# Patient Record
Sex: Female | Born: 1955 | Race: White | Hispanic: No | Smoking: Never smoker
Health system: Southern US, Community
[De-identification: ages and names within clinical notes are randomized; demographics above are authoritative.]

## PROBLEM LIST (undated history)

## (undated) DIAGNOSIS — E119 Type 2 diabetes mellitus without complications: Secondary | ICD-10-CM

## (undated) DIAGNOSIS — E785 Hyperlipidemia, unspecified: Secondary | ICD-10-CM

## (undated) DIAGNOSIS — D034 Melanoma in situ of scalp and neck: Secondary | ICD-10-CM

## (undated) DIAGNOSIS — T7840XA Allergy, unspecified, initial encounter: Secondary | ICD-10-CM

## (undated) HISTORY — DX: Allergy, unspecified, initial encounter: T78.40XA

## (undated) HISTORY — DX: Melanoma in situ of scalp and neck: D03.4

## (undated) HISTORY — DX: Type 2 diabetes mellitus without complications: E11.9

## (undated) HISTORY — PX: FOOT FRACTURE SURGERY: SHX645

## (undated) HISTORY — DX: Hyperlipidemia, unspecified: E78.5

---

## 2016-03-28 LAB — HM DIABETES EYE EXAM

## 2019-12-23 LAB — COLOGUARD
COLOGUARD: NEGATIVE
Cologuard: NEGATIVE

## 2020-03-29 LAB — HM DIABETES EYE EXAM

## 2020-08-25 ENCOUNTER — Other Ambulatory Visit: Payer: Self-pay

## 2020-08-25 ENCOUNTER — Encounter: Payer: Self-pay | Admitting: Radiology

## 2020-08-25 ENCOUNTER — Encounter: Payer: Self-pay | Admitting: Obstetrics & Gynecology

## 2020-08-25 ENCOUNTER — Other Ambulatory Visit (HOSPITAL_COMMUNITY)
Admission: RE | Admit: 2020-08-25 | Discharge: 2020-08-25 | Disposition: A | Discharge status: Home / Self Care | Payer: Medicare Other | Source: Ambulatory Visit | Attending: Obstetrics & Gynecology | Admitting: Obstetrics & Gynecology

## 2020-08-25 ENCOUNTER — Ambulatory Visit (INDEPENDENT_AMBULATORY_CARE_PROVIDER_SITE_OTHER): Payer: Medicare Other | Admitting: Obstetrics & Gynecology

## 2020-08-25 VITALS — BP 112/72 | HR 88 | Ht 67.0 in | Wt 186.0 lb

## 2020-08-25 DIAGNOSIS — N814 Uterovaginal prolapse, unspecified: Secondary | ICD-10-CM

## 2020-08-25 DIAGNOSIS — Z01419 Encounter for gynecological examination (general) (routine) without abnormal findings: Secondary | ICD-10-CM | POA: Insufficient documentation

## 2020-08-25 DIAGNOSIS — Z1231 Encounter for screening mammogram for malignant neoplasm of breast: Secondary | ICD-10-CM

## 2020-08-25 NOTE — Progress Notes (Signed)
GYNECOLOGY ANNUAL PREVENTATIVE CARE ENCOUNTER NOTE  History:     Amber English is a 64 y.o. [id number] female here to establish care and also to discuss concern about possible uterine prolapse. Just moved here from [place], [place], has not seen a GYN provider in years as her PCP was doing her pap smears.  She is due for a routine annual gynecologic exam.  Reports noticing prolapse recently, the bulge causes some irritation in posterior vagina. Also reports urinary symptoms, incomplete emptying also has stress and urge incontinence. History of SVD x 5.  Denies abnormal vaginal bleeding, discharge, pelvic pain, problems with intercourse or other gynecologic concerns.    Gynecologic History No LMP recorded. Patient is postmenopausal. Last Pap: 2018. Results were: normal with negative HPV Last mammogram: 08/2019. Results were: normal  Obstetric History OB History  Gravida Para Term Preterm AB Living  5         5  SAB TAB Ectopic Multiple Live Births          5    # Outcome Date GA Lbr Len/2nd Weight Sex Delivery Anes PTL Lv  5 Gravida 09/1993     Vag-Spont     4 Gravida 07/1987     Vag-Spont     3 Gravida 05/1985     Vag-Spont     2 Gravida 10/1977     Vag-Spont     1 Gravida 11/1971     Vag-Spont       Past Medical History:  Diagnosis Date  . Diabetes mellitus without complication [facility])     Past Surgical History:  Procedure Laterality Date  . FOOT FRACTURE SURGERY      Current Outpatient Medications on File Prior to Visit  Medication Sig Dispense Refill  . aspirin 81 MG chewable tablet Chew 81 mg by mouth daily.    Marland Kitchen atorvastatin (LIPITOR) 80 MG tablet Take 80 mg by mouth daily.    Marland Kitchen gabapentin (NEURONTIN) 300 MG capsule Take 300 mg by mouth 3 (three) times daily.    Marland Kitchen glipiZIDE (GLUCOTROL) 5 MG tablet Take by mouth 2 (two) times daily before a meal.    . lisinopril (ZESTRIL) 2.5 MG tablet Take 2.5 mg by mouth daily.    . metFORMIN (GLUMETZA) 500 MG (MOD) 24 hr tablet  Take 500 mg by mouth daily with breakfast. Take 4 pills a day     No current facility-administered medications on file prior to visit.    No Known Allergies  Social History:  reports that she has never smoked. She has never used smokeless tobacco. She reports current alcohol use. She reports previous drug use.  History reviewed. No pertinent family history.  The following portions of the patient's history were reviewed and updated as appropriate: allergies, current medications, past family history, past medical history, past social history, past surgical history and problem list.  Review of Systems Pertinent items noted in HPI and remainder of comprehensive ROS otherwise negative.  Physical Exam:  BP 112/72   Pulse 88   Ht 5\' 7"  (1.702 m)   Wt 186 lb (84.4 kg)   BMI 29.13 kg/m  CONSTITUTIONAL: Well-developed, well-nourished female in no acute distress.  HENT:  Normocephalic, atraumatic, External right and left ear normal. Oropharynx is clear and moist EYES: Conjunctivae and EOM are normal. Pupils are equal, round, and reactive to light. No scleral icterus.  NECK: Normal range of motion, supple, no masses.  Normal thyroid.  SKIN: Skin is warm and dry. No  rash noted. Not diaphoretic. No erythema. No pallor. MUSCULOSKELETAL: Normal range of motion. No tenderness.  No cyanosis, clubbing, or edema.  2+ distal pulses. NEUROLOGIC: Alert and oriented to person, place, and time. Normal reflexes, muscle tone coordination.  PSYCHIATRIC: Normal mood and affect. Normal behavior. Normal judgment and thought content. CARDIOVASCULAR: Normal heart rate noted, regular rhythm RESPIRATORY: Clear to auscultation bilaterally. Effort and breath sounds normal, no problems with respiration noted. BREASTS: Symmetric in size. No masses, tenderness, skin changes, nipple drainage, or lymphadenopathy bilaterally. Performed in the presence of a chaperone. ABDOMEN: Soft, no distention noted.  No tenderness,  rebound or guarding.  PELVIC: Normal appearing external genitalia and urethral meatus with moderate atrophy.  Grade 2 cystocele noted immediately.  Atrophic vaginal mucosa and cervix, Grade 1 uterine prolapse noted.  Small rectocele noted.   No abnormal discharge noted.  Pap smear obtained.  Normal uterine size, no other palpable masses, no uterine or adnexal tenderness.  If pessary needed, needs one at least 7-8 cm wide. Performed in the presence of a chaperone.   Assessment and Plan:      1. Breast cancer screening by mammogram Mammogram scheduled - MM 3D SCREEN BREAST BILATERAL; Future  2. Well woman exam with routine gynecological exam - Cytology - PAP Will follow up results of pap smear and manage accordingly. Routine preventative health maintenance measures emphasized.  3. Uterovaginal prolapse Discussed management strategies: pessary vs surgery.  She desires surgical intervention. Also requires full pelvic organ prolapse staging and possible urodynamics given her symptoms. Referral placed to Dr. Lavella Hammock at [facility]. She declined pessary use in the interim. - Ambulatory referral to Urogynecology  Please refer to After Visit Summary for other counseling recommendations.      Jaynie Collins, MD, FACOG Obstetrician & Gynecologist, [facility] for [employer], [facility]

## 2020-08-25 NOTE — Patient Instructions (Signed)
Thank you for enrolling in [facility]. Please follow the instructions below to securely access your online medical record. MyChart allows you to send messages to your doctor, view your test results, manage appointments, and more.   How Do I Sign Up? 1. In your Internet browser, go to [employer] and enter [email or web address]. 2. Click on the Sign Up Now link in the Sign In box. You will see the New Member Sign Up page. 3. Enter your MyChart Access Code exactly as it appears below. You will not need to use this code after you've completed the sign-up process. If you do not sign up before the expiration date, you must request a new code.  MyChart Access Code: [email or web address] Expires: 10/09/2020 10:24 AM  4. Enter your Social Security Number ([id number]) and Date of Birth (mm/dd/yyyy) as indicated and click Submit. You will be taken to the next sign-up page. 5. Create a MyChart ID. This will be your MyChart login ID and cannot be changed, so think of one that is secure and easy to remember. 6. Create a MyChart password. You can change your password at any time. 7. Enter your Password Reset Question and Answer. This can be used at a later time if you forget your password.  8. Enter your e-mail address. You will receive e-mail notification when new information is available in [facility]. 9. Click Sign Up. You can now view your medical record.   Additional Information Remember, MyChart is NOT to be used for urgent needs. For medical emergencies, dial 911.   Pelvic Organ Prolapse Pelvic organ prolapse is the stretching, bulging, or dropping of pelvic organs into an abnormal position. It happens when the muscles and tissues that surround and support pelvic structures become weak or stretched. Pelvic organ prolapse can involve the:  Vagina (vaginal prolapse).  Uterus (uterine prolapse).  Bladder (cystocele).  Rectum (rectocele).  Intestines (enterocele). When organs other than the  vagina are involved, they often bulge into the vagina or protrude from the vagina, depending on how severe the prolapse is. What are the causes? This condition may be caused by:  Pregnancy, labor, and childbirth.  Past pelvic surgery.  Decreased production of the hormone estrogen associated with menopause.  Consistently lifting more than 50 lb (23 kg).  Obesity.  Long-term inability to pass stool (chronic constipation).  A cough that lasts a long time (chronic).  Buildup of fluid in the abdomen due to certain diseases and other conditions. What are the signs or symptoms? Symptoms of this condition include:  Passing a little urine (loss of bladder control) when you cough, sneeze, strain, and exercise (stress incontinence). This may be worse immediately after childbirth. It may gradually improve over time.  Feeling pressure in your pelvis or vagina. This pressure may increase when you cough or when you are passing stool.  A bulge that protrudes from the opening of your vagina.  Difficulty passing urine or stool.  Pain in your lower back.  Pain, discomfort, or disinterest in sex.  Repeated bladder infections (urinary tract infections).  Difficulty inserting a tampon. In some people, this condition causes no symptoms. How is this diagnosed? This condition may be diagnosed based on a vaginal and rectal exam. During the exam, you may be asked to cough and strain while you are lying down, sitting, and standing up. Your health care provider will determine if other tests are required, such as bladder function tests. How is this treated? Treatment for this condition may depend on your symptoms.  Treatment may include:  Lifestyle changes, such as changes to your diet.  Emptying your bladder at scheduled times (bladder training therapy). This can help reduce or avoid urinary incontinence.  Estrogen. Estrogen may help mild prolapse by increasing the strength and tone of pelvic floor  muscles.  Kegel exercises. These may help mild cases of prolapse by strengthening and tightening the muscles of the pelvic floor.  A soft, flexible device that helps support the vaginal walls and keep pelvic organs in place (pessary). This is inserted into your vagina by your health care provider.  Surgery. This is often the only form of treatment for severe prolapse. Follow these instructions at home:  Avoid drinking beverages that contain caffeine or alcohol.  Increase your intake of high-fiber foods. This can help decrease constipation and straining during bowel movements.  Lose weight if recommended by your health care provider.  Wear a sanitary pad or adult diapers if you have urinary incontinence.  Avoid heavy lifting and straining with exercise and work. Do not hold your breath when you perform mild to moderate lifting and exercise activities. Limit your activities as directed by your health care provider.  Do Kegel exercises as directed by your health care provider. To do this: ? Squeeze your pelvic floor muscles tight. You should feel a tight lift in your rectal area and a tightness in your vaginal area. Keep your stomach, buttocks, and legs relaxed. ? Hold the muscles tight for up to 10 seconds. ? Relax your muscles. ? Repeat this exercise 50 times a day, or as many times as told by your health care provider. Continue to do this exercise for at least 4-6 weeks, or for as long as told by your health care provider.  Take over-the-counter and prescription medicines only as told by your health care provider.  If you have a pessary, take care of it as told by your health care provider.  Keep all follow-up visits as told by your health care provider. This is important. Contact a health care provider if you:  Have symptoms that interfere with your daily activities or sex life.  Need medicine to help with the discomfort.  Notice bleeding from your vagina that is not related to  your period.  Have a fever.  Have pain or bleeding when you urinate.  Have bleeding when you pass stool.  Pass urine when you have sex.  Have chronic constipation.  Have a pessary that falls out.  Have bad smelling vaginal discharge.  Have an unusual, low pain in your abdomen. Summary  Pelvic organ prolapse is the stretching, bulging, or dropping of pelvic organs into an abnormal position. It happens when the muscles and tissues that surround and support pelvic structures become weak or stretched.  When organs other than the vagina are involved, they often bulge into the vagina or protrude from the vagina, depending on how severe the prolapse is.  In most cases, this condition needs to be treated only if it produces symptoms. Treatment may include lifestyle changes, estrogen, Kegel exercises, pessary insertion, or surgery.  Avoid heavy lifting and straining with exercise and work. Do not hold your breath when you perform mild to moderate lifting and exercise activities. Limit your activities as directed by your health care provider. This information is not intended to replace advice given to you by your health care provider. Make sure you discuss any questions you have with your health care provider. Document Revised: 11/20/2017 Document Reviewed: 11/20/2017 Elsevier Patient Education  [place] 65 Years and Older, Female Preventive care refers to lifestyle choices and visits with your health care provider that can promote health and wellness. This includes:  A yearly physical exam. This is also called an annual well check.  Regular dental and eye exams.  Immunizations.  Screening for certain conditions.  Healthy lifestyle choices, such as diet and exercise. What can I expect for my preventive care visit? Physical exam Your health care provider will check:  Height and weight. These may be used to calculate body mass index (BMI), which is a  measurement that tells if you are at a healthy weight.  Heart rate and blood pressure.  Your skin for abnormal spots. Counseling Your health care provider may ask you questions about:  Alcohol, tobacco, and drug use.  Emotional well-being.  Home and relationship well-being.  Sexual activity.  Eating habits.  History of falls.  Memory and ability to understand (cognition).  Work and work Statistician.  Pregnancy and menstrual history. What immunizations do I need?  Influenza (flu) vaccine  This is recommended every year. Tetanus, diphtheria, and pertussis (Tdap) vaccine  You may need a Td booster every 10 years. Varicella (chickenpox) vaccine  You may need this vaccine if you have not already been vaccinated. Zoster (shingles) vaccine  You may need this after age 14. Pneumococcal conjugate (PCV13) vaccine  One dose is recommended after age 67. Pneumococcal polysaccharide (PPSV23) vaccine  One dose is recommended after age 68. Measles, mumps, and rubella (MMR) vaccine  You may need at least one dose of MMR if you were born in 1957 or later. You may also need a second dose. Meningococcal conjugate (MenACWY) vaccine  You may need this if you have certain conditions. Hepatitis A vaccine  You may need this if you have certain conditions or if you travel or work in places where you may be exposed to hepatitis A. Hepatitis B vaccine  You may need this if you have certain conditions or if you travel or work in places where you may be exposed to hepatitis B. Haemophilus influenzae type b (Hib) vaccine  You may need this if you have certain conditions. You may receive vaccines as individual doses or as more than one vaccine together in one shot (combination vaccines). Talk with your health care provider about the risks and benefits of combination vaccines. What tests do I need? Blood tests  Lipid and cholesterol levels. These may be checked every 5 years, or more  frequently depending on your overall health.  Hepatitis C test.  Hepatitis B test. Screening  Lung cancer screening. You may have this screening every year starting at age 22 if you have a 30-pack-year history of smoking and currently smoke or have quit within the past 15 years.  Colorectal cancer screening. All adults should have this screening starting at age 21 and continuing until age 41. Your health care provider may recommend screening at age 47 if you are at increased risk. You will have tests every 1-10 years, depending on your results and the type of screening test.  Diabetes screening. This is done by checking your blood sugar (glucose) after you have not eaten for a while (fasting). You may have this done every 1-3 years.  Mammogram. This may be done every 1-2 years. Talk with your health care provider about how often you should have regular mammograms.  BRCA-related cancer screening. This may be done if you have a family history of  breast, ovarian, tubal, or peritoneal cancers. Other tests  Sexually transmitted disease (STD) testing.  Bone density scan. This is done to screen for osteoporosis. You may have this done starting at age 28. Follow these instructions at home: Eating and drinking  Eat a diet that includes fresh fruits and vegetables, whole grains, lean protein, and low-fat dairy products. Limit your intake of foods with high amounts of sugar, saturated fats, and salt.  Take vitamin and mineral supplements as recommended by your health care provider.  Do not drink alcohol if your health care provider tells you not to drink.  If you drink alcohol: ? Limit how much you have to 0-1 drink a day. ? Be aware of how much alcohol is in your drink. In the [place] one drink equals one 12 oz bottle of beer (355 mL), one 5 oz glass of wine (148 mL), or one 1 oz glass of hard liquor (44 mL). Lifestyle  Take daily care of your teeth and gums.  Stay active. Exercise for at  least 30 minutes on 5 or more days each week.  Do not use any products that contain nicotine or tobacco, such as cigarettes, e-cigarettes, and chewing tobacco. If you need help quitting, ask your health care provider.  If you are sexually active, practice safe sex. Use a condom or other form of protection in order to prevent STIs (sexually transmitted infections).  Talk with your health care provider about taking a low-dose aspirin or statin. What's next?  Go to your health care provider once a year for a well check visit.  Ask your health care provider how often you should have your eyes and teeth checked.  Stay up to date on all vaccines. This information is not intended to replace advice given to you by your health care provider. Make sure you discuss any questions you have with your health care provider. Document Revised: 10/23/2018 Document Reviewed: 10/23/2018 Elsevier Patient Education  2020 [employer].

## 2020-08-26 LAB — CYTOLOGY - PAP
Comment: NEGATIVE
Diagnosis: NEGATIVE
High risk HPV: NEGATIVE

## 2020-09-15 ENCOUNTER — Encounter: Payer: Self-pay | Admitting: Family Medicine

## 2020-09-20 ENCOUNTER — Ambulatory Visit
Admission: RE | Admit: 2020-09-20 | Discharge: 2020-09-20 | Disposition: A | Discharge status: Home / Self Care | Payer: Medicare Other | Source: Ambulatory Visit | Attending: Obstetrics & Gynecology | Admitting: Obstetrics & Gynecology

## 2020-09-20 ENCOUNTER — Other Ambulatory Visit: Payer: Self-pay

## 2020-09-20 DIAGNOSIS — Z1231 Encounter for screening mammogram for malignant neoplasm of breast: Secondary | ICD-10-CM

## 2020-10-25 ENCOUNTER — Encounter: Payer: Self-pay | Admitting: Internal Medicine

## 2020-10-25 ENCOUNTER — Other Ambulatory Visit: Payer: Self-pay

## 2020-10-25 ENCOUNTER — Ambulatory Visit (INDEPENDENT_AMBULATORY_CARE_PROVIDER_SITE_OTHER): Payer: Medicare Other | Admitting: Internal Medicine

## 2020-10-25 VITALS — BP 114/70 | HR 84 | Temp 96.7°F | Ht 66.75 in | Wt 183.0 lb

## 2020-10-25 DIAGNOSIS — E1141 Type 2 diabetes mellitus with diabetic mononeuropathy: Secondary | ICD-10-CM

## 2020-10-25 DIAGNOSIS — Z85820 Personal history of malignant melanoma of skin: Secondary | ICD-10-CM | POA: Diagnosis not present

## 2020-10-25 DIAGNOSIS — E7800 Pure hypercholesterolemia, unspecified: Secondary | ICD-10-CM | POA: Diagnosis not present

## 2020-10-25 DIAGNOSIS — E785 Hyperlipidemia, unspecified: Secondary | ICD-10-CM | POA: Insufficient documentation

## 2020-10-25 LAB — COMPREHENSIVE METABOLIC PANEL
ALT: 16 U/L (ref 0–35)
AST: 15 U/L (ref 0–37)
Albumin: 4.5 g/dL (ref 3.5–5.2)
Alkaline Phosphatase: 65 U/L (ref 39–117)
BUN: 14 mg/dL (ref 6–23)
CO2: 31 mEq/L (ref 19–32)
Calcium: 10.2 mg/dL (ref 8.4–10.5)
Chloride: 103 mEq/L (ref 96–112)
Creatinine, Ser: 0.72 mg/dL (ref 0.40–1.20)
GFR: 87.3 mL/min (ref >60.00)
Glucose, Bld: 67 mg/dL — ABNORMAL LOW (ref 70–99)
Potassium: 4.5 mEq/L (ref 3.5–5.1)
Sodium: 140 mEq/L (ref 135–145)
Total Bilirubin: 0.4 mg/dL (ref 0.2–1.2)
Total Protein: 7.3 g/dL (ref 6.0–8.3)

## 2020-10-25 LAB — LIPID PANEL
Cholesterol: 172 mg/dL (ref 0–200)
HDL: 62.1 mg/dL (ref >39.00)
LDL Cholesterol: 78 mg/dL (ref 0–99)
NonHDL: 109.87
Total CHOL/HDL Ratio: 3
Triglycerides: 161 mg/dL — ABNORMAL HIGH (ref 0.0–149.0)
VLDL: 32.2 mg/dL (ref 0.0–40.0)

## 2020-10-25 LAB — HEMOGLOBIN A1C: Hgb A1c MFr Bld: 6.5 % (ref 4.6–6.5)

## 2020-10-25 NOTE — Progress Notes (Signed)
HPI  Pt presents to the clinic today to establish care and for management of the conditions listed below. She is transferring care from her PCP in [place], [place].  DM 2: Her last A1C was 7.2, 05/2020. She is taking Metformin, Glipizide and Gabapentin as prescribed. She is on Lisinopril for renal protection. Her sugars range 110-149. She checks her feet routinely.  HLD: She does have some myalgias on on Atorvastatin. She tries to consume a low fat diet.  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 No chemo or radiation. She follows with dermatology yearly.  Flu: 08/2019 Tetanus: unsure Covid: Pfizer Pneumovax: ? 2008 Prevnar: never Zostovax: 2008 Pap Smear: 08/2020 Mammogram: 09/2020 Bone Density: 12/2019 Cologuard: 05/2020 Vision Screening: annually Dentist: as needed  Past Medical History:  Diagnosis Date  . Diabetes mellitus without complication (HCC)   . Hyperlipidemia   . Melanoma in situ of scalp [facility])     Current Outpatient Medications  Medication Sig Dispense Refill  . aspirin 81 MG chewable tablet Chew 81 mg by mouth daily.    Marland Kitchen atorvastatin (LIPITOR) 80 MG tablet Take 80 mg by mouth daily.    Marland Kitchen b complex vitamins capsule Take 1 capsule by mouth daily.    . Biotin 1000 MCG tablet Take by mouth.    . calcium-vitamin D (OSCAL WITH D) 500-200 MG-UNIT tablet Take 1 tablet by mouth.    . co-enzyme Q-10 30 MG capsule Take 30 mg by mouth 3 (three) times daily.    . diphenhydrAMINE (BENADRYL) 25 mg capsule Take 50 mg by mouth in the morning and at bedtime.    . gabapentin (NEURONTIN) 300 MG capsule Take 300 mg by mouth 3 (three) times daily.    Marland Kitchen glipiZIDE (GLUCOTROL) 5 MG tablet Take by mouth 2 (two) times daily before a meal.    . glucosamine-chondroitin 500-400 MG tablet Take 1 tablet by mouth 3 (three) times daily.    Marland Kitchen lisinopril (ZESTRIL) 2.5 MG tablet Take 2.5 mg by mouth daily.    . metFORMIN (GLUMETZA) 500 MG (MOD) 24 hr tablet Take 2,000 mg by mouth daily with  breakfast. Take 4 pills a day    . Multiple Vitamin (MULTIVITAMIN) capsule Take 1 capsule by mouth daily.     No current facility-administered medications for this visit.    No Known Allergies  Family History  Problem Relation Age of Onset  . Esophageal cancer Mother   . Diabetes Father   . Heart disease Father   . Diabetes Sister   . Diabetes Brother   . Heart disease Maternal Grandfather   . Diabetes Paternal Grandmother   . Cancer Paternal Grandmother   . Heart disease Paternal Grandfather     Social History   Socioeconomic History  . Marital status: Divorced    Spouse name: Not on file  . Number of children: Not on file  . Years of education: Not on file  . Highest education level: Not on file  Occupational History  . Not on file  Tobacco Use  . Smoking status: Never Smoker  . Smokeless tobacco: Never Used  Vaping Use  . Vaping Use: Never used  Substance and Sexual Activity  . Alcohol use: Yes    Comment: occasional  . Drug use: Not Currently  . Sexual activity: Not Currently    Birth control/protection: None  Other Topics Concern  . Not on file  Social History Narrative  . Not on file   Social Determinants of Health  Financial Resource Strain: Not on file  Food Insecurity: Not on file  Transportation Needs: Not on file  Physical Activity: Not on file  Stress: Not on file  Social Connections: Not on file  Intimate Partner Violence: Not on file    ROS:  Constitutional: Denies fever, malaise, fatigue, headache or abrupt weight changes.  HEENT: Denies eye pain, eye redness, ear pain, ringing in the ears, wax buildup, runny nose, nasal congestion, bloody nose, or sore throat. Respiratory: Denies difficulty breathing, shortness of breath, cough or sputum production.   Cardiovascular: Denies chest pain, chest tightness, palpitations or swelling in the hands or feet.  Gastrointestinal: Denies abdominal pain, bloating, constipation, diarrhea or blood in  the stool.  GU: Denies frequency, urgency, pain with urination, blood in urine, odor or discharge. Musculoskeletal: Pt reports intermittent joint pain. Denies decrease in range of motion, difficulty with gait, muscle pain or joint swelling.  Skin: Denies redness, rashes, lesions or ulcercations.  Neurological: Pt reports neuropathic pain in feet. Denies dizziness, difficulty with memory, difficulty with speech or problems with balance and coordination.  Psych: Denies anxiety, depression, SI/HI.  No other specific complaints in a complete review of systems (except as listed in HPI above).  PE:  BP 114/70   Pulse 84   Temp (!) 96.7 F (35.9 C) (Temporal)   Ht 5' 6.75" (1.695 m)   Wt 183 lb (83 kg)   SpO2 98%   BMI 28.88 kg/m   Wt Readings from Last 3 Encounters:  08/25/20 186 lb (84.4 kg)    General: Appears her stated age, well developed, well nourished in NAD. Skin: Dry and intact. No ulcerations noted. HEENT: Head: normal shape and size; Eyes: sclera white, no icterus, conjunctiva pink, PERRLA and EOMs intact;  Cardiovascular: Normal rate and rhythm. S1,S2 noted.  No murmur, rubs or gallops noted. No JVD or BLE edema. No carotid bruits noted. Pulmonary/Chest: Normal effort and positive vesicular breath sounds. No respiratory distress. No wheezes, rales or ronchi noted.  Musculoskeletal: No difficulty with gait.  Neurological: Alert and oriented.  Psychiatric: Mood and affect normal. Behavior is normal. Judgment and thought content normal.     Assessment and Plan:

## 2020-10-25 NOTE — Assessment & Plan Note (Signed)
A1C, CMET and Lipid profile today Encouraged her to consume a low fat, low carb diet and exercise for weight loss Continue Metformin, Glipizide, Gabapentin and Lisinopril Encouraged routine foot exams Encouraged routine eye exams Due for flu and pneumovax Covid UTD

## 2020-10-25 NOTE — Assessment & Plan Note (Signed)
Follows with dermatology yearly.

## 2020-10-25 NOTE — Assessment & Plan Note (Addendum)
CMET and Lipid profile today Continue Atorvastatin Encouraged her to consume a low fat diet 

## 2020-10-25 NOTE — Patient Instructions (Signed)

## 2020-10-26 ENCOUNTER — Encounter: Payer: Self-pay | Admitting: Internal Medicine

## 2020-11-12 HISTORY — PX: TOTAL ABDOMINAL HYSTERECTOMY: SHX209

## 2020-12-01 ENCOUNTER — Encounter: Payer: Self-pay | Admitting: Radiology

## 2021-02-07 ENCOUNTER — Encounter: Payer: Self-pay | Admitting: Internal Medicine

## 2021-02-09 MED ORDER — METFORMIN HCL ER (MOD) 1000 MG PO TB24: 2000.0000 mg | ORAL_TABLET | Freq: Every day | ORAL | 1 refills | Status: DC

## 2021-02-09 MED ORDER — LISINOPRIL 2.5 MG PO TABS
2.5000 mg | ORAL_TABLET | Freq: Every day | ORAL | 1 refills | Status: DC
Start: 2021-02-08 — End: 2021-08-09

## 2021-02-09 MED ORDER — METFORMIN HCL ER 500 MG PO TB24: 2000.0000 mg | ORAL_TABLET | Freq: Every day | ORAL | 1 refills | Status: DC

## 2021-02-09 MED ORDER — ATORVASTATIN CALCIUM 80 MG PO TABS
80.0000 mg | ORAL_TABLET | Freq: Every day | ORAL | 1 refills | Status: DC
Start: 2021-02-08 — End: 2021-03-28

## 2021-02-09 MED ORDER — GABAPENTIN 300 MG PO CAPS
300.0000 mg | ORAL_CAPSULE | Freq: Three times a day (TID) | ORAL | 1 refills | Status: DC
Start: 2021-02-08 — End: 2022-01-30

## 2021-02-09 MED ORDER — METFORMIN HCL ER (MOD) 500 MG PO TB24
2000.0000 mg | ORAL_TABLET | Freq: Every day | ORAL | 1 refills | Status: DC
Start: 2021-02-08 — End: 2021-02-09

## 2021-02-09 MED ORDER — GLIPIZIDE 5 MG PO TABS
5.0000 mg | ORAL_TABLET | Freq: Two times a day (BID) | ORAL | 1 refills | Status: DC
Start: 2021-02-08 — End: 2021-08-09

## 2021-02-09 NOTE — Addendum Note (Signed)
Addended by: Roena Malady on: 02/09/2021 09:58 AM   Modules accepted: Orders

## 2021-02-09 NOTE — Telephone Encounter (Signed)
Can you check on this?

## 2021-02-09 NOTE — Addendum Note (Signed)
Addended by: Roena Malady on: 02/09/2021 11:55 AM   Modules accepted: Orders

## 2021-03-09 ENCOUNTER — Telehealth: Payer: Self-pay | Admitting: Primary Care

## 2021-03-09 NOTE — Telephone Encounter (Addendum)
Amber English, patient will be transferring care over to [place] from [facility] and will need to be seen in May for follow up. I've spoken with patient via phone already. She just needs to be scheduled, can you reach out to her personally?  She will need a lab appt a few days prior to her appt with me. She is aware.

## 2021-03-10 NOTE — Telephone Encounter (Signed)
Called both appointments have been made with patient. Will let us know if any issues.

## 2021-03-13 ENCOUNTER — Other Ambulatory Visit: Payer: Self-pay

## 2021-03-13 DIAGNOSIS — E7800 Pure hypercholesterolemia, unspecified: Secondary | ICD-10-CM

## 2021-03-13 DIAGNOSIS — E1141 Type 2 diabetes mellitus with diabetic mononeuropathy: Secondary | ICD-10-CM

## 2021-03-22 ENCOUNTER — Other Ambulatory Visit: Payer: Self-pay | Admitting: Primary Care

## 2021-03-22 ENCOUNTER — Other Ambulatory Visit (INDEPENDENT_AMBULATORY_CARE_PROVIDER_SITE_OTHER): Payer: Medicare Other

## 2021-03-22 ENCOUNTER — Other Ambulatory Visit: Payer: Self-pay

## 2021-03-22 DIAGNOSIS — E1141 Type 2 diabetes mellitus with diabetic mononeuropathy: Secondary | ICD-10-CM

## 2021-03-22 DIAGNOSIS — E7800 Pure hypercholesterolemia, unspecified: Secondary | ICD-10-CM | POA: Diagnosis not present

## 2021-03-22 LAB — COMPREHENSIVE METABOLIC PANEL
ALT: 16 U/L (ref 0–35)
AST: 15 U/L (ref 0–37)
Albumin: 4.2 g/dL (ref 3.5–5.2)
Alkaline Phosphatase: 62 U/L (ref 39–117)
BUN: 18 mg/dL (ref 6–23)
CO2: 31 mEq/L (ref 19–32)
Calcium: 9.9 mg/dL (ref 8.4–10.5)
Chloride: 103 mEq/L (ref 96–112)
Creatinine, Ser: 0.75 mg/dL (ref 0.40–1.20)
GFR: 82.89 mL/min (ref >60.00)
Glucose, Bld: 116 mg/dL — ABNORMAL HIGH (ref 70–99)
Potassium: 4.6 mEq/L (ref 3.5–5.1)
Sodium: 140 mEq/L (ref 135–145)
Total Bilirubin: 0.4 mg/dL (ref 0.2–1.2)
Total Protein: 6.7 g/dL (ref 6.0–8.3)

## 2021-03-22 LAB — LIPID PANEL
Cholesterol: 177 mg/dL (ref 0–200)
HDL: 61.7 mg/dL (ref >39.00)
LDL Cholesterol: 88 mg/dL (ref 0–99)
NonHDL: 114.94
Total CHOL/HDL Ratio: 3
Triglycerides: 135 mg/dL (ref 0.0–149.0)
VLDL: 27 mg/dL (ref 0.0–40.0)

## 2021-03-22 LAB — CBC WITH DIFFERENTIAL/PLATELET
Basophils Absolute: 0.1 10*3/uL (ref 0.0–0.1)
Basophils Relative: 0.8 % (ref 0.0–3.0)
Eosinophils Absolute: 0.2 10*3/uL (ref 0.0–0.7)
Eosinophils Relative: 2.5 % (ref 0.0–5.0)
HCT: 37.4 % (ref 36.0–46.0)
Hemoglobin: 12.7 g/dL (ref 12.0–15.0)
Lymphocytes Relative: 34.1 % (ref 12.0–46.0)
Lymphs Abs: 2.8 10*3/uL (ref 0.7–4.0)
MCHC: 34.1 g/dL (ref 30.0–36.0)
MCV: 93 fl (ref 78.0–100.0)
Monocytes Absolute: 0.6 10*3/uL (ref 0.1–1.0)
Monocytes Relative: 7 % (ref 3.0–12.0)
Neutro Abs: 4.6 10*3/uL (ref 1.4–7.7)
Neutrophils Relative %: 55.6 % (ref 43.0–77.0)
Platelets: 233 10*3/uL (ref 150.0–400.0)
RBC: 4.02 Mil/uL (ref 3.87–5.11)
RDW: 13.4 % (ref 11.5–15.5)
WBC: 8.2 10*3/uL (ref 4.0–10.5)

## 2021-03-22 LAB — HEMOGLOBIN A1C: Hgb A1c MFr Bld: 6.4 % (ref 4.6–6.5)

## 2021-03-22 NOTE — Addendum Note (Signed)
Addended by: Aquilla Solian on: 03/22/2021 02:11 PM   Modules accepted: Orders

## 2021-03-23 LAB — MICROALBUMIN / CREATININE URINE RATIO
Creatinine,U: 47.6 mg/dL
Microalb Creat Ratio: 9.7 mg/g (ref 0.0–30.0)
Microalb, Ur: 4.6 mg/dL — ABNORMAL HIGH (ref 0.0–1.9)

## 2021-03-28 ENCOUNTER — Ambulatory Visit (INDEPENDENT_AMBULATORY_CARE_PROVIDER_SITE_OTHER)
Admission: RE | Admit: 2021-03-28 | Discharge: 2021-03-28 | Disposition: A | Discharge status: Home / Self Care | Payer: Medicare Other | Source: Ambulatory Visit | Attending: Primary Care | Admitting: Primary Care

## 2021-03-28 ENCOUNTER — Encounter: Payer: Self-pay | Admitting: Primary Care

## 2021-03-28 ENCOUNTER — Ambulatory Visit (INDEPENDENT_AMBULATORY_CARE_PROVIDER_SITE_OTHER): Payer: Medicare Other | Admitting: Primary Care

## 2021-03-28 ENCOUNTER — Other Ambulatory Visit: Payer: Self-pay

## 2021-03-28 ENCOUNTER — Ambulatory Visit (HOSPITAL_BASED_OUTPATIENT_CLINIC_OR_DEPARTMENT_OTHER): Payer: Medicare Other | Admitting: Family Medicine

## 2021-03-28 VITALS — BP 120/74 | HR 80 | Temp 98.6°F | Ht 66.75 in | Wt 188.0 lb

## 2021-03-28 DIAGNOSIS — H9193 Unspecified hearing loss, bilateral: Secondary | ICD-10-CM

## 2021-03-28 DIAGNOSIS — E1141 Type 2 diabetes mellitus with diabetic mononeuropathy: Secondary | ICD-10-CM

## 2021-03-28 DIAGNOSIS — M25551 Pain in right hip: Secondary | ICD-10-CM

## 2021-03-28 DIAGNOSIS — Z85820 Personal history of malignant melanoma of skin: Secondary | ICD-10-CM | POA: Diagnosis not present

## 2021-03-28 DIAGNOSIS — G8929 Other chronic pain: Secondary | ICD-10-CM

## 2021-03-28 DIAGNOSIS — E7800 Pure hypercholesterolemia, unspecified: Secondary | ICD-10-CM | POA: Diagnosis not present

## 2021-03-28 MED ORDER — METFORMIN HCL ER 500 MG PO TB24: 1000.0000 mg | ORAL_TABLET | Freq: Two times a day (BID) | ORAL | 1 refills | Status: DC

## 2021-03-28 MED ORDER — DICLOFENAC SODIUM 75 MG PO TBEC: 75.0000 mg | DELAYED_RELEASE_TABLET | Freq: Two times a day (BID) | ORAL | 1 refills | Status: DC | PRN

## 2021-03-28 NOTE — Assessment & Plan Note (Signed)
Cerumen noted to bilateral canals.  Patient consented to irrigation. Bilateral canals irrigated. TMs and canals unremarkable post irrigation. Patient tolerated well and was able to hear a little bit better.

## 2021-03-28 NOTE — Assessment & Plan Note (Signed)
Chronic for years, slowly progressing.  Exam today with clear decreased range of motion, likely bursitis/arthritis.  Start with plain films of the right hip. Discussed physical therapy versus orthopedic evaluation.  Prescription for diclofenac 75 mg tablets sent to pharmacy to use as needed.  Await results

## 2021-03-28 NOTE — Progress Notes (Signed)
Subjective:    Patient ID: Amber English, female    DOB: [birth year], 65 y.o.   MRN: [id number]  HPI  Amber English is a very pleasant 66 y.o. female patient of Nicki Reaper who presents today to transfer care.  1) Type 2 Diabetes:  Current medications include: Metformin XR 2000 mg daily, glipizide 5 mg BID.   Last A1C: 6.5 in December 2021, 6.4 last week Last Eye Exam: UTD Last Foot Exam: Due Pneumonia Vaccination: UTD ACE/ARB: Lisinopril 2.5 mg Statin: atorvastatin 40 mg.   She is also managed on gabapentin 300 mg TID for diabetic neuropathy for which she notices to her feet at night. She does notice numbness up her legs at times. She is taking gabapentin 2-3 times daily on average.   2) Hyperlipidemia: Previously managed on atorvastatin 80 mg, but has been cutting her dose to 40 mg for the last 6 months. Recent lipid panel with LDL of 88, HDL of 61.  3) Joint Pain: Mostly feels to the right hip, chronic for which she initially noticed in 2003.  Since then her pain has progressed slowly, now has to sleep on her back as she cannot lay on either side due to pain. She experiences pain when walking up and down the stairs. She has no difficulty with normal walking, can walk 5000 steps daily without problems.  She recently went on a trip to [facility] and walked 20,000 steps which did bother her hip.  She will take ibuprofen 2-3 times weekly with improvement in pain.  Also with chronic right 5th metacarpal digit pain for the last 6 months, at times her finger will get stuck, she will have to manual adjust. She's been using Voltaren Gel with improvement. She crotchets everyday and has had to alter the way she crochets, also cannot completely close her hands to grip objects.    4) Decreased Hearing: Has been told by her children that she's not hearing what they are saying. Has been told that the TV is turned up too loud. History of cerumen impaction over the years.  She has not undergone  a hearing test.   Review of Systems  HENT:       Decreased hearing  Respiratory: Negative for shortness of breath.   Cardiovascular: Negative for chest pain.  Musculoskeletal: Positive for arthralgias.       See HPI  Neurological:       Neuropathy pain         Past Medical History:  Diagnosis Date  . Diabetes mellitus without complication (HCC)   . Hyperlipidemia   . Melanoma in situ of scalp [facility])     Social History   Socioeconomic History  . Marital status: Divorced    Spouse name: Not on file  . Number of children: Not on file  . Years of education: Not on file  . Highest education level: Not on file  Occupational History  . Not on file  Tobacco Use  . Smoking status: Never Smoker  . Smokeless tobacco: Never Used  Vaping Use  . Vaping Use: Never used  Substance and Sexual Activity  . Alcohol use: Yes    Comment: occasional  . Drug use: Not Currently  . Sexual activity: Not Currently    Birth control/protection: None  Other Topics Concern  . Not on file  Social History Narrative  . Not on file   Social Determinants of Health   Financial Resource Strain: Not on file  Food Insecurity: Not  on file  Transportation Needs: Not on file  Physical Activity: Not on file  Stress: Not on file  Social Connections: Not on file  Intimate Partner Violence: Not on file    Past Surgical History:  Procedure Laterality Date  . FOOT FRACTURE SURGERY      Family History  Problem Relation Age of Onset  . Esophageal cancer Mother   . Diabetes Father   . Heart disease Father   . Diabetes Sister   . Diabetes Brother   . Heart disease Maternal Grandfather   . Diabetes Paternal Grandmother   . Cancer Paternal Grandmother   . Heart disease Paternal Grandfather     No Known Allergies  Current Outpatient Medications on File Prior to Visit  Medication Sig Dispense Refill  . aspirin 81 MG chewable tablet Chew 81 mg by mouth daily.    Marland Kitchen atorvastatin (LIPITOR) 80 MG  tablet Take 1 tablet (80 mg total) by mouth daily. (Patient taking differently: Take 40 mg by mouth daily.) 90 tablet 1  . b complex vitamins capsule Take 1 capsule by mouth daily.    . Biotin 1000 MCG tablet Take by mouth.    . calcium-vitamin D (OSCAL WITH D) 500-200 MG-UNIT tablet Take 1 tablet by mouth.    . co-enzyme Q-10 30 MG capsule Take 30 mg by mouth daily.    Marland Kitchen gabapentin (NEURONTIN) 300 MG capsule Take 1 capsule (300 mg total) by mouth 3 (three) times daily. 180 capsule 1  . glipiZIDE (GLUCOTROL) 5 MG tablet Take 1 tablet (5 mg total) by mouth 2 (two) times daily before a meal. 180 tablet 1  . glucosamine-chondroitin 500-400 MG tablet Take 1 tablet by mouth 3 (three) times daily.    Marland Kitchen lisinopril (ZESTRIL) 2.5 MG tablet Take 1 tablet (2.5 mg total) by mouth daily. 90 tablet 1  . metFORMIN (GLUCOPHAGE-XR) 500 MG 24 hr tablet Take 4 tablets (2,000 mg total) by mouth daily with breakfast. 360 tablet 1  . Multiple Vitamin (MULTIVITAMIN) capsule Take 1 capsule by mouth daily.     No current facility-administered medications on file prior to visit.    BP 120/74   Pulse 80   Temp 98.6 F (37 C) (Temporal)   Ht 5' 6.75" (1.695 m)   Wt 188 lb (85.3 kg)   SpO2 98%   BMI 29.67 kg/m  Objective:   Physical Exam Cardiovascular:     Rate and Rhythm: Normal rate and regular rhythm.  Pulmonary:     Effort: Pulmonary effort is normal.     Breath sounds: Normal breath sounds.  Musculoskeletal:     Cervical back: Neck supple.     Right hip: Decreased range of motion. Normal strength.     Left hip: Normal range of motion. Normal strength.     Comments: Decrease in range of motion with pain to right hip with external rotation.  Skin:    General: Skin is warm and dry.           Assessment & Plan:      This visit occurred during the SARS-CoV-2 public health emergency.  Safety protocols were in place, including screening questions prior to the visit, additional usage of staff PPE,  and extensive cleaning of exam room while observing appropriate contact time as indicated for disinfecting solutions.

## 2021-03-28 NOTE — Assessment & Plan Note (Signed)
Follows with dermatology annually

## 2021-03-28 NOTE — Assessment & Plan Note (Signed)
LDL under control on atorvastatin 40 mg for which she reduced down from 80 mg months ago.  Continue atorvastatin 40 mg.

## 2021-03-28 NOTE — Assessment & Plan Note (Addendum)
Well controlled in the office today with A1C of 6.4!  Continue metformin XR 2000 mg BID and Glipizide 5 mg BID.  Continue gabapentin 300 mg 2-3 times daily.  Urine microalbumin negative. Pneumonia vaccine UTD. Managed on statin.  Follow up in 6 months

## 2021-03-28 NOTE — Patient Instructions (Signed)
Complete xray(s) prior to leaving today. I will notify you of your results once received.  You can take the diclofenac tablets twice daily as needed for hip pain and trigger finger pain.  We may need to consider physical therapy vs orthopedic evaluation of your hip and finger.   Please schedule a follow up appointment in 6 months.   It was a pleasure to see you today!

## 2021-04-04 LAB — HM DIABETES EYE EXAM

## 2021-04-07 ENCOUNTER — Encounter: Payer: Self-pay | Admitting: Primary Care

## 2021-04-13 ENCOUNTER — Encounter: Payer: Self-pay | Admitting: Primary Care

## 2021-06-05 ENCOUNTER — Other Ambulatory Visit: Payer: Self-pay

## 2021-06-05 DIAGNOSIS — S99929A Unspecified injury of unspecified foot, initial encounter: Secondary | ICD-10-CM

## 2021-06-05 DIAGNOSIS — E1141 Type 2 diabetes mellitus with diabetic mononeuropathy: Secondary | ICD-10-CM

## 2021-06-06 ENCOUNTER — Other Ambulatory Visit: Payer: Self-pay | Admitting: Primary Care

## 2021-06-06 DIAGNOSIS — G8929 Other chronic pain: Secondary | ICD-10-CM

## 2021-06-06 NOTE — Telephone Encounter (Signed)
Please notify patient that we received a refill request for the diclofenac that I prescribed for pain to the hip. Is she taking? If so, how often? Does she need a refill?

## 2021-06-12 ENCOUNTER — Ambulatory Visit: Payer: Medicare Other | Admitting: Podiatry

## 2021-06-12 NOTE — Telephone Encounter (Signed)
Called patient she is taking PRN on average 2-3 times a week for hip pain. Is very helpful. Does not need refill at this time will send my chart message when needed.

## 2021-06-22 ENCOUNTER — Ambulatory Visit: Payer: Medicare Other | Admitting: Podiatry

## 2021-06-26 ENCOUNTER — Ambulatory Visit (INDEPENDENT_AMBULATORY_CARE_PROVIDER_SITE_OTHER): Payer: Medicare Other | Admitting: Podiatry

## 2021-06-26 ENCOUNTER — Other Ambulatory Visit: Payer: Self-pay

## 2021-06-26 ENCOUNTER — Encounter: Payer: Self-pay | Admitting: Podiatry

## 2021-06-26 DIAGNOSIS — M79674 Pain in right toe(s): Secondary | ICD-10-CM | POA: Diagnosis not present

## 2021-06-26 DIAGNOSIS — E1140 Type 2 diabetes mellitus with diabetic neuropathy, unspecified: Secondary | ICD-10-CM

## 2021-06-26 DIAGNOSIS — B351 Tinea unguium: Secondary | ICD-10-CM | POA: Diagnosis not present

## 2021-06-26 DIAGNOSIS — L600 Ingrowing nail: Secondary | ICD-10-CM

## 2021-06-26 DIAGNOSIS — M79675 Pain in left toe(s): Secondary | ICD-10-CM

## 2021-06-26 DIAGNOSIS — E1149 Type 2 diabetes mellitus with other diabetic neurological complication: Secondary | ICD-10-CM | POA: Diagnosis not present

## 2021-06-26 NOTE — Progress Notes (Signed)
Subjective:   Patient ID: Amber English, female   DOB: 65 y.o.   MRN: [id number]   HPI Patient states she seems to have a possible nail fungus and detached nails and states that the patient also has diabetes and is concerned about her inability to cut all of her toenails and they do get irritated in the corners and thick.  Patient does not smoke likes to be active   Review of Systems  All other systems reviewed and are negative.      Objective:  Physical Exam Vitals and nursing note reviewed.  Constitutional:      Appearance: She is well-developed.  Pulmonary:     Effort: Pulmonary effort is normal.  Musculoskeletal:        General: Normal range of motion.  Skin:    General: Skin is warm.  Neurological:     Mental Status: She is alert.    Neurovascular status was found to be normal as far circulatory status with moderate diminishment sharp dull vibratory.  Patient is found to have nail disease with the hallux and third nails having some discoloration and thickness of the beds and inability to cut comfortably.  Patient does have moderate neuropathic-like symptomatology upon questioning long-term history of diabetes under good control currently     Assessment:  Patient who has chronic nail disease bilateral with thickness and loss of several nails over time along with moderate neuropathic symptomatology consistent with long-term diabetes that she is attempting to keep under control     Plan:  H&P reviewed neuropathy and the importance of daily inspections of her feet.  Debrided nailbeds 1-5 both feet no iatrogenic bleeding if anything were ever to get red or draining she is to let us know immediately and she is to do daily inspections of her feet

## 2021-08-05 ENCOUNTER — Other Ambulatory Visit: Payer: Self-pay | Admitting: Internal Medicine

## 2021-08-05 DIAGNOSIS — E1141 Type 2 diabetes mellitus with diabetic mononeuropathy: Secondary | ICD-10-CM

## 2021-08-05 NOTE — Telephone Encounter (Signed)
   Notes to clinic Not a pt in this practice.  

## 2021-08-22 ENCOUNTER — Other Ambulatory Visit: Payer: Self-pay | Admitting: Primary Care

## 2021-08-22 DIAGNOSIS — G8929 Other chronic pain: Secondary | ICD-10-CM

## 2021-08-22 DIAGNOSIS — M25551 Pain in right hip: Secondary | ICD-10-CM

## 2021-09-08 ENCOUNTER — Other Ambulatory Visit: Payer: Self-pay | Admitting: Internal Medicine

## 2021-09-08 DIAGNOSIS — E7800 Pure hypercholesterolemia, unspecified: Secondary | ICD-10-CM

## 2021-09-14 MED ORDER — ATORVASTATIN CALCIUM 40 MG PO TABS: 40.0000 mg | ORAL_TABLET | Freq: Every day | ORAL | 3 refills | Status: DC

## 2021-09-28 ENCOUNTER — Other Ambulatory Visit: Payer: Self-pay | Admitting: Obstetrics & Gynecology

## 2021-09-28 DIAGNOSIS — Z1231 Encounter for screening mammogram for malignant neoplasm of breast: Secondary | ICD-10-CM

## 2021-09-29 ENCOUNTER — Ambulatory Visit
Admission: RE | Admit: 2021-09-29 | Discharge: 2021-09-29 | Disposition: A | Discharge status: Home / Self Care | Payer: Medicare Other | Source: Ambulatory Visit | Attending: Obstetrics & Gynecology | Admitting: Obstetrics & Gynecology

## 2021-09-29 ENCOUNTER — Other Ambulatory Visit: Payer: Self-pay

## 2021-09-29 DIAGNOSIS — Z1231 Encounter for screening mammogram for malignant neoplasm of breast: Secondary | ICD-10-CM

## 2021-10-03 ENCOUNTER — Other Ambulatory Visit: Payer: Self-pay | Admitting: Obstetrics & Gynecology

## 2021-10-03 DIAGNOSIS — R928 Other abnormal and inconclusive findings on diagnostic imaging of breast: Secondary | ICD-10-CM

## 2021-10-24 ENCOUNTER — Other Ambulatory Visit: Payer: Self-pay | Admitting: Primary Care

## 2021-10-24 DIAGNOSIS — G8929 Other chronic pain: Secondary | ICD-10-CM

## 2021-10-24 NOTE — Telephone Encounter (Signed)
Please call patient:  Received refill request for diclofenac for pain, is she taking this daily?  Also, needs diabetes follow up. Can we get her scheduled? If she doesn't want to come to [place] then we can do a virtual and then labs at [place] station.

## 2021-10-25 ENCOUNTER — Other Ambulatory Visit: Payer: Self-pay | Admitting: Primary Care

## 2021-10-25 DIAGNOSIS — E1141 Type 2 diabetes mellitus with diabetic mononeuropathy: Secondary | ICD-10-CM

## 2021-10-25 NOTE — Telephone Encounter (Signed)
Spoke with patient. Patient states she is taking Diclofenac 1 tablet at bedtime daily. She found that this regimen helps her arthritis/hip pain and she sleeps better. Appointment for DM follow up scheduled for 11/15/21

## 2021-10-26 NOTE — Telephone Encounter (Signed)
Noted. Refill(s) sent to pharmacy.  

## 2021-11-08 ENCOUNTER — Ambulatory Visit
Admission: RE | Admit: 2021-11-08 | Discharge: 2021-11-08 | Disposition: A | Discharge status: Home / Self Care | Payer: Medicare Other | Source: Ambulatory Visit | Attending: Obstetrics & Gynecology | Admitting: Obstetrics & Gynecology

## 2021-11-08 DIAGNOSIS — R928 Other abnormal and inconclusive findings on diagnostic imaging of breast: Secondary | ICD-10-CM

## 2021-11-15 ENCOUNTER — Encounter: Payer: Self-pay | Admitting: Primary Care

## 2021-11-15 ENCOUNTER — Ambulatory Visit (INDEPENDENT_AMBULATORY_CARE_PROVIDER_SITE_OTHER): Payer: Medicare Other | Admitting: Primary Care

## 2021-11-15 ENCOUNTER — Other Ambulatory Visit: Payer: Self-pay

## 2021-11-15 VITALS — BP 110/62 | HR 73 | Temp 97.6°F | Ht 66.75 in | Wt 196.0 lb

## 2021-11-15 DIAGNOSIS — G8929 Other chronic pain: Secondary | ICD-10-CM

## 2021-11-15 DIAGNOSIS — M25551 Pain in right hip: Secondary | ICD-10-CM | POA: Diagnosis not present

## 2021-11-15 DIAGNOSIS — I8393 Asymptomatic varicose veins of bilateral lower extremities: Secondary | ICD-10-CM | POA: Diagnosis not present

## 2021-11-15 DIAGNOSIS — E1141 Type 2 diabetes mellitus with diabetic mononeuropathy: Secondary | ICD-10-CM

## 2021-11-15 LAB — BASIC METABOLIC PANEL
BUN: 16 mg/dL (ref 6–23)
CO2: 27 mEq/L (ref 19–32)
Calcium: 9.6 mg/dL (ref 8.4–10.5)
Chloride: 104 mEq/L (ref 96–112)
Creatinine, Ser: 0.86 mg/dL (ref 0.40–1.20)
GFR: 70.02 mL/min (ref >60.00)
Glucose, Bld: 94 mg/dL (ref 70–99)
Potassium: 4.6 mEq/L (ref 3.5–5.1)
Sodium: 140 mEq/L (ref 135–145)

## 2021-11-15 LAB — POCT GLYCOSYLATED HEMOGLOBIN (HGB A1C): Hemoglobin A1C: 6.5 % — AB (ref 4.0–5.6)

## 2021-11-15 NOTE — Assessment & Plan Note (Signed)
Well controlled with A1C of 6.5 today!  Encouraged to work on weight loss as she has gained about 10 pounds since last visit.  Continue metformin XR 1000 mg BID and glipizide 5 mg BID.  Foot exam today. Managed on statin. Urine microalbumin UTD.  Follow up in 6 months.

## 2021-11-15 NOTE — Assessment & Plan Note (Signed)
With two episodes of bleeding, uncomplicated.  Recommended compression socks. Stop aspirin.  Discussed vascular referral but she kindly declined but will notify me if bleeding returns.

## 2021-11-15 NOTE — Assessment & Plan Note (Signed)
Doing very well on diclofenac. Encouraged PRN use rather than daily use if possible, she has already adopted this.  Checking BMP today.

## 2021-11-15 NOTE — Progress Notes (Signed)
Subjective:    Patient ID: Amber English, female    DOB: 04/15/1956, 66 y.o.   MRN: [id number]  HPI  Amber English is a very pleasant 66 y.o. female with a history of type 2 diabetes, hyperlipidemia, chronic hip pain who presents today for follow up of diabetes and hip pain. She would also like to mention bleeding varicose vein.  1) Type 2 Diabetes:  Current medications include: metformin XR 1000 mg BID, glipizide 5 mg BID.   She is checking her blood glucose 1 times daily and is getting readings of 130's.   Last A1C: 6.4 in May 2022, 6.5 today Last Eye Exam: UTD Last Foot Exam: Due Pneumonia Vaccination: 2016 Urine Microalbumin: None. Managed on ACE-I Statin: atorvastatin   Dietary changes since last visit: Poor diet over the last several months including the holidays. Is now back on track..  Exercise: None. Plans to start again soon.  2) Chronic Hip Pain: Chronic to right hip for years. She underwent plain films of the right hip on 03/30/21 which revealed mild osteoarthritis. She is currently managed on diclofenac 75 mg daily PRN for which she takes as needed with improvement. She is not taking this daily as she once was.   She is managed on gabapentin 300 mg BID for neuropathy.   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   In September 2022 she was at a wedding, scratched a scab which then began "squirting blood" contentiously, took about 15 minutes after compression to stop. In October 2022 she noticed a black spot along the same vein for which had the scab, scratched the black spot as she thought it was a tick, and began bleeding as she did the prior month. She had to hold a towel  with pressure to the site.   She has a family history of varicose veins in her father who required vein stripping. Her older son has varicose veins and has had several procedures. She is managed on aspirin 81 mg.    Review of Systems  Eyes:  Negative for visual disturbance.  Respiratory:  Negative for shortness of breath.   Cardiovascular:  Negative for chest pain.  Neurological:  Negative for headaches.        Past Medical History:  Diagnosis Date   Diabetes mellitus without complication (HCC)    Hyperlipidemia    Melanoma in situ of scalp (HCC)     Social History   Socioeconomic History   Marital status: Divorced    Spouse name: Not on file   Number of children: Not on file   Years of education: Not on file   Highest education level: Not on file  Occupational History   Not on file  Tobacco Use   Smoking status: Never   Smokeless tobacco: Never  Vaping Use   Vaping Use: Never used  Substance and Sexual Activity   Alcohol use: Yes    Comment: occasional   Drug use: Not Currently   Sexual activity: Not Currently    Birth control/protection: None  Other Topics Concern   Not on file  Social History Narrative   Not on file   Social  Determinants of Health   Financial Resource Strain: Not on file  Food Insecurity: Not on file  Transportation Needs: Not on file  Physical Activity: Not on file  Stress: Not on file  Social Connections: Not on file  Intimate Partner Violence: Not on file    Past Surgical History:  Procedure Laterality Date   FOOT FRACTURE SURGERY     TOTAL ABDOMINAL HYSTERECTOMY  2022    Family History  Problem Relation Age of Onset   Esophageal cancer Mother    Diabetes Father    Heart disease Father    Diabetes Sister    Diabetes Brother    Heart disease Brother    Heart disease Maternal Grandfather    Diabetes Paternal Grandmother    Cancer Paternal Grandmother    Heart disease Paternal Grandfather    Lung cancer Sister     No  Known Allergies  Current Outpatient Medications on File Prior to Visit  Medication Sig Dispense Refill   aspirin 81 MG chewable tablet Chew 81 mg by mouth daily.     atorvastatin (LIPITOR) 40 MG tablet Take 1 tablet (40 mg total) by mouth daily. For cholesterol. 90 tablet 3   b complex vitamins capsule Take 1 capsule by mouth daily.     Biotin 10000 MCG TBDP Take by mouth.     calcium-vitamin D (OSCAL WITH D) 500-200 MG-UNIT tablet Take 1 tablet by mouth.     diclofenac (VOLTAREN) 75 MG EC tablet Take 1 tablet (75 mg total) by mouth at bedtime as needed for moderate pain. 90 tablet 1   gabapentin (NEURONTIN) 300 MG capsule Take 1 capsule (300 mg total) by mouth 3 (three) times daily. 180 capsule 1   glipiZIDE (GLUCOTROL) 5 MG tablet Take 1 tablet (5 mg total) by mouth 2 (two) times daily before a meal. For diabetes. 180 tablet 2   glucosamine-chondroitin 500-400 MG tablet Take 1 tablet by mouth 3 (three) times daily.     lisinopril (ZESTRIL) 2.5 MG tablet Take 1 tablet (2.5 mg total) by mouth daily. For kidney protection. 90 tablet 2   metFORMIN (GLUCOPHAGE-XR) 500 MG 24 hr tablet Take 2 tablets (1,000 mg total) by mouth 2 (two) times daily with a meal. For diabetes. Office visit required for further refills. 360 tablet 0   Multiple Vitamin (MULTIVITAMIN) capsule Take 1 capsule by mouth daily.     phenazopyridine (PYRIDIUM) 95 MG tablet Take by mouth.     triamcinolone cream (KENALOG) 0.1 % Apply topically 2 (two) times daily.     co-enzyme Q-10 30 MG capsule Take 30 mg by mouth daily. (Patient not taking: Reported on 11/15/2021)     estradiol (ESTRACE) 0.1 MG/GM vaginal cream Discard plastic applicator. Insert blueberry size amount of cream on finger in vagina daily x1 week then 2x per week. (Patient not taking: Reported on 11/15/2021)     No current facility-administered medications on file prior to visit.    BP 110/62    Pulse 73    Temp 97.6 F (36.4 C) (Temporal)    Ht 5' 6.75" (1.695 m)     Wt 196 lb (88.9 kg)    SpO2 97%    BMI 30.93 kg/m  Objective:   Physical Exam Cardiovascular:     Rate and Rhythm: Normal rate and regular rhythm.     Comments: Numerous superficial spider veins noted to bilateral ankles. No infection, erythema, engorged veins. Pulmonary:     Effort: Pulmonary effort is normal.  Breath sounds: Normal breath sounds.  Musculoskeletal:     Cervical back: Neck supple.  Skin:    General: Skin is warm and dry.          Assessment & Plan:      This visit occurred during the SARS-CoV-2 public health emergency.  Safety protocols were in place, including screening questions prior to the visit, additional usage of staff PPE, and extensive cleaning of exam room while observing appropriate contact time as indicated for disinfecting solutions.

## 2021-11-15 NOTE — Patient Instructions (Signed)
It is important that you improve your diet. Please limit carbohydrates in the form of white bread, rice, pasta, sweets, fast food, fried food, sugary drinks, etc. Increase your consumption of fresh fruits and vegetables, whole grains, lean protein.  Ensure you are consuming 64 ounces of water daily.  Start exercising. You should be getting 150 minutes of moderate intensity exercise weekly.  Stop by the lab prior to leaving today. I will notify you of your results once received.   Start wearing compression socks for your veins.  Stop Asprin.   We will see you in 6 months!

## 2022-01-20 ENCOUNTER — Other Ambulatory Visit: Payer: Self-pay | Admitting: Primary Care

## 2022-01-20 DIAGNOSIS — E1141 Type 2 diabetes mellitus with diabetic mononeuropathy: Secondary | ICD-10-CM

## 2022-01-30 ENCOUNTER — Other Ambulatory Visit: Payer: Self-pay | Admitting: Primary Care

## 2022-01-30 ENCOUNTER — Other Ambulatory Visit: Payer: Self-pay | Admitting: Internal Medicine

## 2022-01-30 DIAGNOSIS — E1141 Type 2 diabetes mellitus with diabetic mononeuropathy: Secondary | ICD-10-CM

## 2022-01-30 DIAGNOSIS — G8929 Other chronic pain: Secondary | ICD-10-CM

## 2022-01-30 DIAGNOSIS — J309 Allergic rhinitis, unspecified: Secondary | ICD-10-CM

## 2022-01-30 MED ORDER — GABAPENTIN 300 MG PO CAPS: 300.0000 mg | ORAL_CAPSULE | Freq: Three times a day (TID) | ORAL | 0 refills | Status: DC

## 2022-01-30 MED ORDER — CETIRIZINE HCL 10 MG PO TABS: 10.0000 mg | ORAL_TABLET | Freq: Every day | ORAL | 0 refills | Status: AC | PRN

## 2022-04-18 LAB — HM DIABETES EYE EXAM

## 2022-04-30 ENCOUNTER — Encounter: Payer: Self-pay | Admitting: Primary Care

## 2022-05-01 ENCOUNTER — Other Ambulatory Visit: Payer: Self-pay | Admitting: Primary Care

## 2022-05-01 DIAGNOSIS — E1141 Type 2 diabetes mellitus with diabetic mononeuropathy: Secondary | ICD-10-CM

## 2022-05-08 IMAGING — MG MM DIGITAL SCREENING BILAT W/ TOMO AND CAD
8 series · 8 of 24 positions shown · non-contrast
Comparison: Previous exam(s).

CLINICAL DATA: Screening.

EXAM:
DIGITAL SCREENING BILATERAL MAMMOGRAM WITH TOMOSYNTHESIS AND CAD
TECHNIQUE: Bilateral screening digital craniocaudal and mediolateral oblique
mammograms were obtained. Bilateral screening digital breast
tomosynthesis was performed. The images were evaluated with
computer-aided detection.

[R MLO synth-2D]
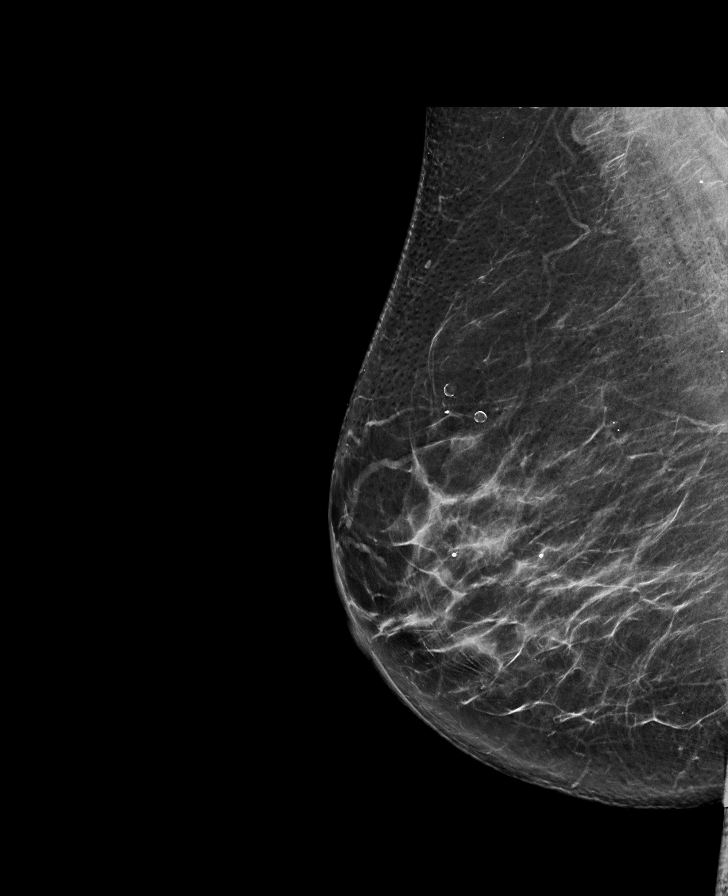

[R CC synth-2D]
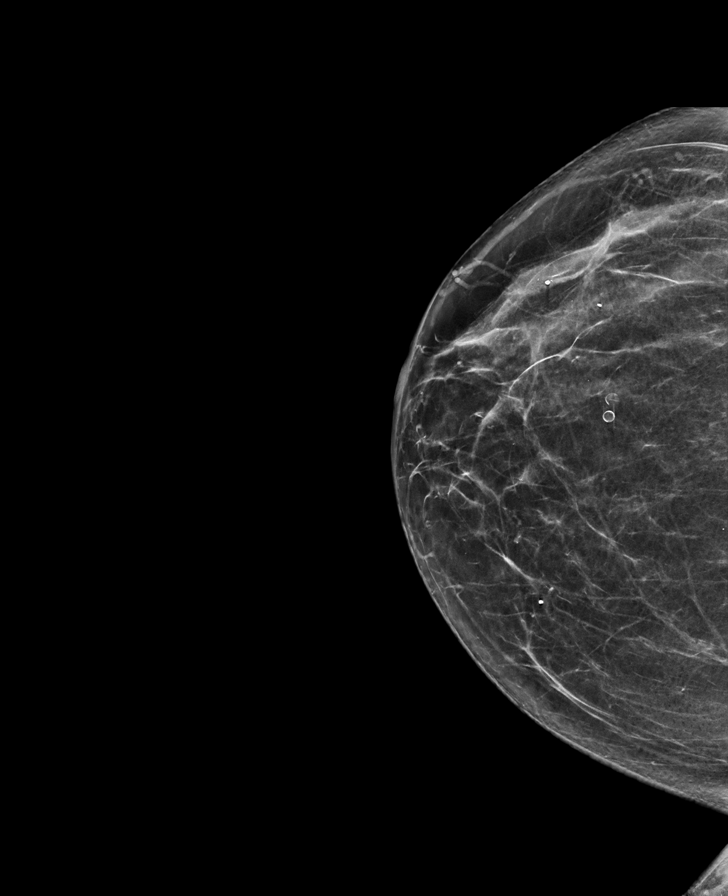

[L MLO synth-2D]
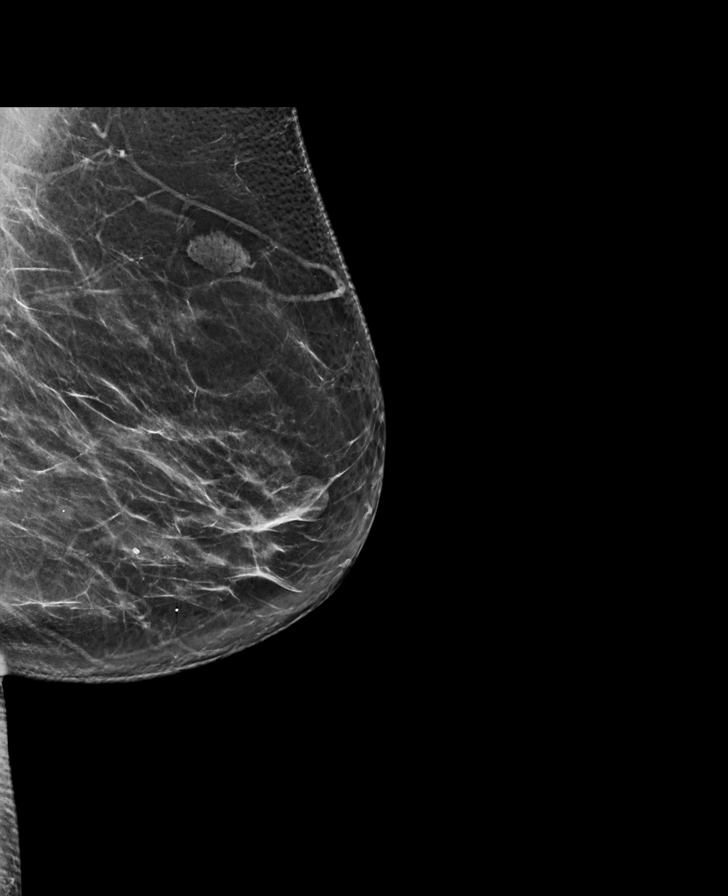

[L CC synth-2D]
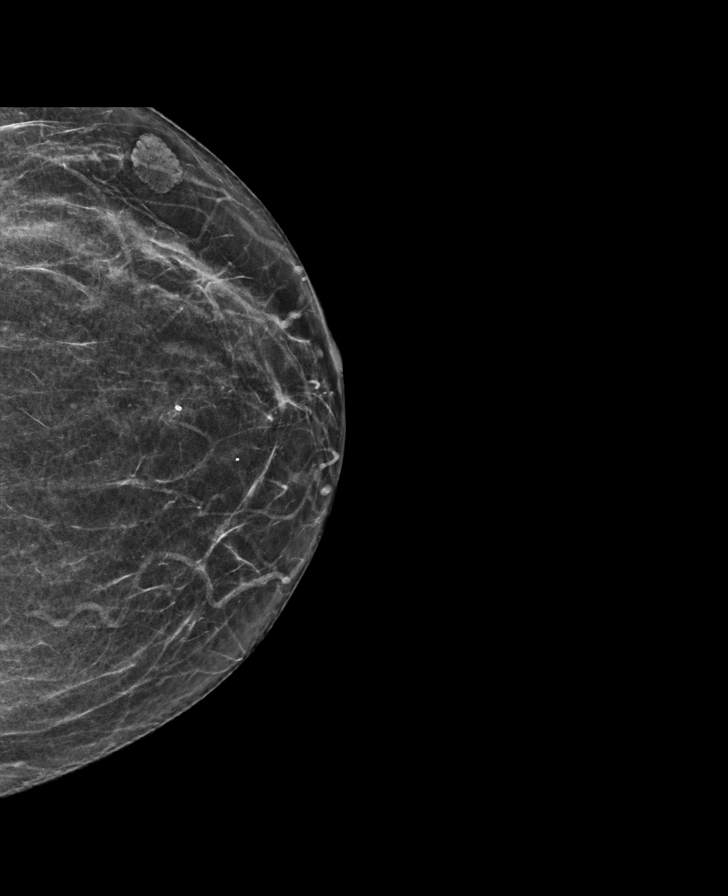

[L CC tomo · tomo slice 33/65.0]
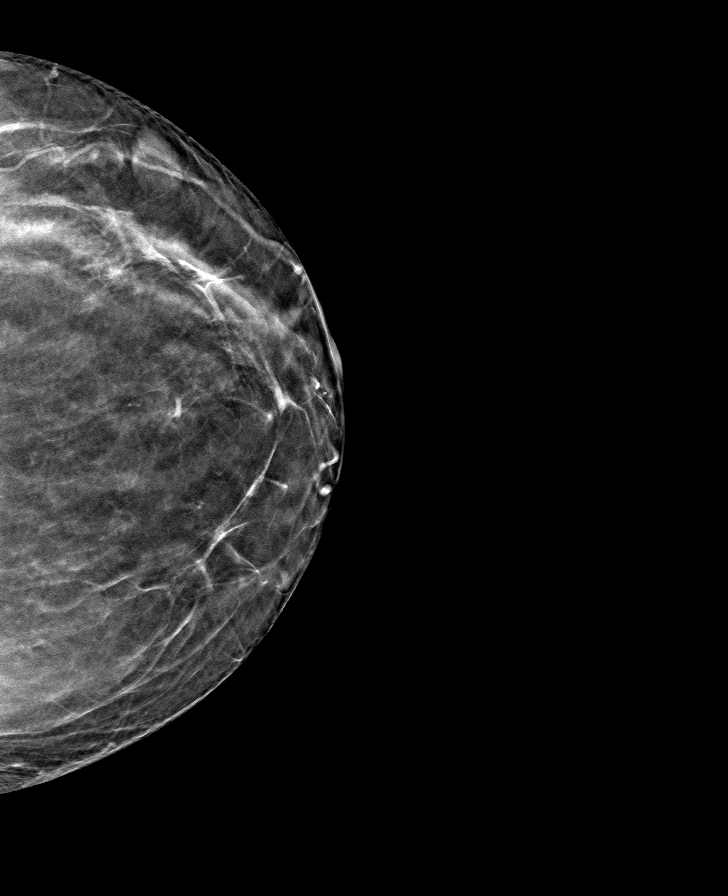

[R MLO tomo · tomo slice 41/80.0]
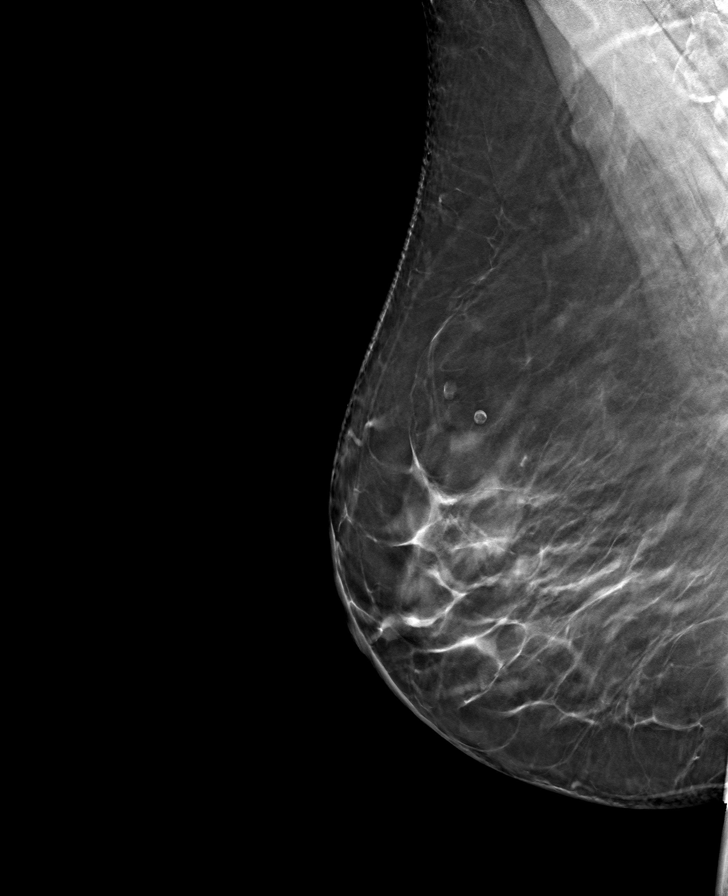

[L MLO tomo · tomo slice 41/81.0]
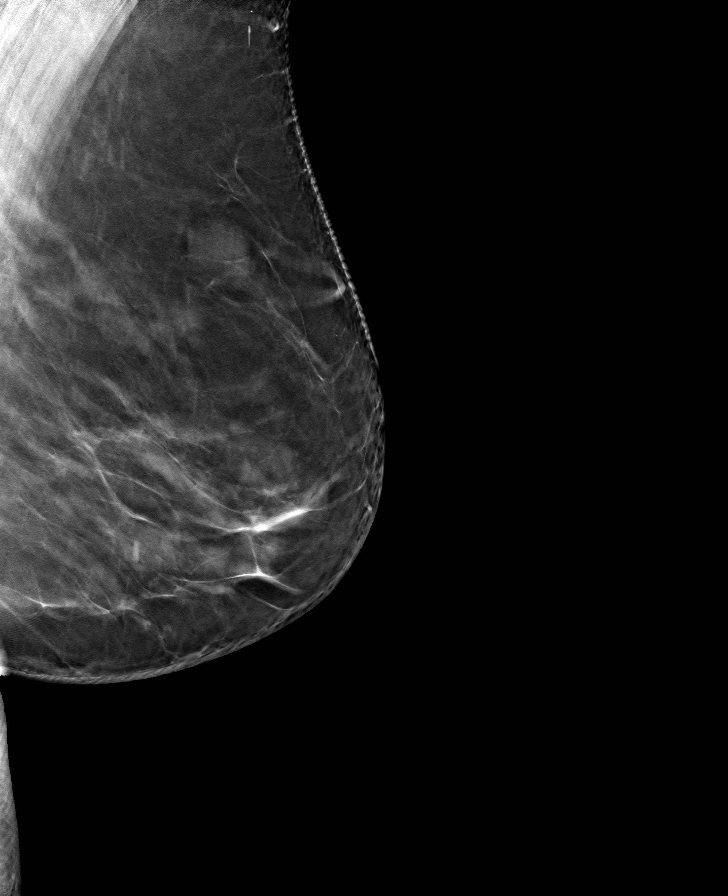

[R CC tomo · tomo slice 35/68.0]
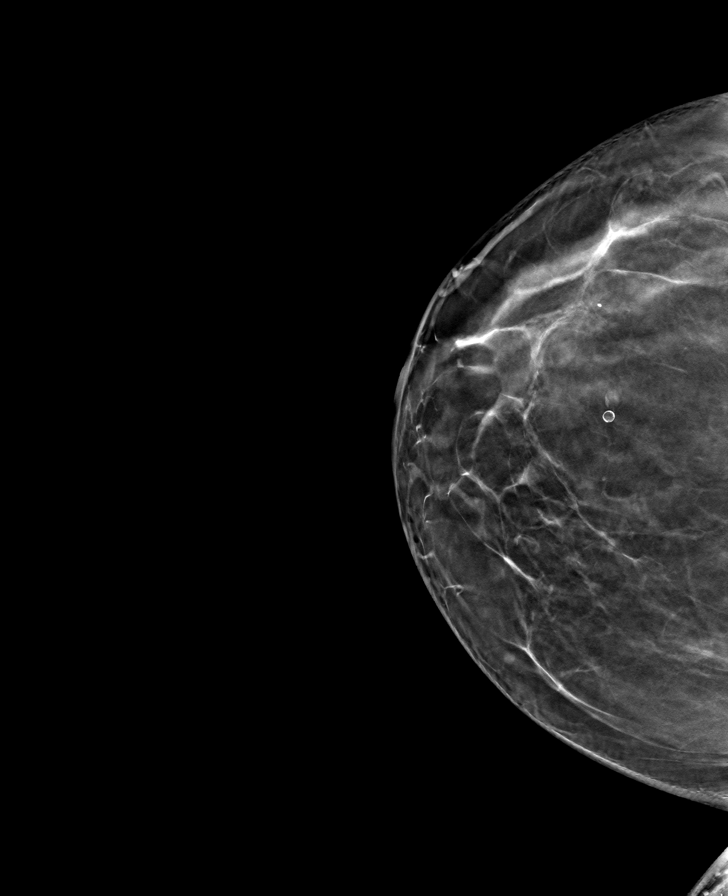

[8 of 24 positions shown; findings below may reference images not displayed]

ACR Breast Density Category b: There are scattered areas of
fibroglandular density.
FINDINGS: In the right breast, a possible mass warrants further evaluation.
This possible small mass is seen within the slightly lower RIGHT
breast, MLO view only, slice 46.

In the left breast, no findings suspicious for malignancy.
IMPRESSION: Further evaluation is suggested for a possible mass in the right
breast.

RECOMMENDATION:
Diagnostic mammogram and possibly ultrasound of the right breast.
(Code:C9-6-AA9)

The patient will be contacted regarding the findings, and additional
imaging will be scheduled.

BI-RADS CATEGORY  0: Incomplete. Need additional imaging evaluation
and/or prior mammograms for comparison.

## 2022-05-23 ENCOUNTER — Ambulatory Visit (INDEPENDENT_AMBULATORY_CARE_PROVIDER_SITE_OTHER): Payer: Medicare Other | Admitting: Primary Care

## 2022-05-23 VITALS — BP 124/72 | HR 81 | Temp 98.6°F | Ht 66.75 in | Wt 198.0 lb

## 2022-05-23 DIAGNOSIS — F32A Depression, unspecified: Secondary | ICD-10-CM | POA: Diagnosis not present

## 2022-05-23 DIAGNOSIS — Z85820 Personal history of malignant melanoma of skin: Secondary | ICD-10-CM

## 2022-05-23 DIAGNOSIS — F419 Anxiety disorder, unspecified: Secondary | ICD-10-CM

## 2022-05-23 DIAGNOSIS — R6889 Other general symptoms and signs: Secondary | ICD-10-CM

## 2022-05-23 DIAGNOSIS — E7800 Pure hypercholesterolemia, unspecified: Secondary | ICD-10-CM | POA: Diagnosis not present

## 2022-05-23 DIAGNOSIS — E1141 Type 2 diabetes mellitus with diabetic mononeuropathy: Secondary | ICD-10-CM

## 2022-05-23 LAB — LIPID PANEL
Cholesterol: 175 mg/dL (ref 0–200)
HDL: 59.5 mg/dL (ref >39.00)
LDL Cholesterol: 83 mg/dL (ref 0–99)
NonHDL: 115.93
Total CHOL/HDL Ratio: 3
Triglycerides: 163 mg/dL — ABNORMAL HIGH (ref 0.0–149.0)
VLDL: 32.6 mg/dL (ref 0.0–40.0)

## 2022-05-23 LAB — COMPREHENSIVE METABOLIC PANEL
ALT: 17 U/L (ref 0–35)
AST: 17 U/L (ref 0–37)
Albumin: 4.5 g/dL (ref 3.5–5.2)
Alkaline Phosphatase: 68 U/L (ref 39–117)
BUN: 14 mg/dL (ref 6–23)
CO2: 29 mEq/L (ref 19–32)
Calcium: 10 mg/dL (ref 8.4–10.5)
Chloride: 104 mEq/L (ref 96–112)
Creatinine, Ser: 0.81 mg/dL (ref 0.40–1.20)
GFR: 74.96 mL/min (ref >60.00)
Glucose, Bld: 122 mg/dL — ABNORMAL HIGH (ref 70–99)
Potassium: 4.7 mEq/L (ref 3.5–5.1)
Sodium: 139 mEq/L (ref 135–145)
Total Bilirubin: 0.5 mg/dL (ref 0.2–1.2)
Total Protein: 6.7 g/dL (ref 6.0–8.3)

## 2022-05-23 LAB — HEMOGLOBIN A1C: Hgb A1c MFr Bld: 7.1 % — ABNORMAL HIGH (ref 4.6–6.5)

## 2022-05-23 LAB — CBC
HCT: 38.2 % (ref 36.0–46.0)
Hemoglobin: 12.9 g/dL (ref 12.0–15.0)
MCHC: 33.8 g/dL (ref 30.0–36.0)
MCV: 95.4 fl (ref 78.0–100.0)
Platelets: 232 10*3/uL (ref 150.0–400.0)
RBC: 4 Mil/uL (ref 3.87–5.11)
RDW: 13.1 % (ref 11.5–15.5)
WBC: 7.9 10*3/uL (ref 4.0–10.5)

## 2022-05-23 NOTE — Assessment & Plan Note (Addendum)
Following with Dr. Graciela Husbands in [place]. Continue to monitor precancerous lesions.   Continue Efudex 0.5% cream BID.

## 2022-05-23 NOTE — Assessment & Plan Note (Signed)
Controlled.  Continue with therapy every 2 weeks.  Feels well managed.  Continue to monitor.

## 2022-05-23 NOTE — Assessment & Plan Note (Signed)
Repeat A1C pending.  Continue metformin XR 1000 mg BID, glipizide 5 mg BID for now. Consider Ozempic and stopping glipizide.   Await results.  Managed on statin and ACE-I. Pneumonia vaccine UTD.  Follow up in 6 months

## 2022-05-23 NOTE — Progress Notes (Signed)
Subjective:    Patient ID: Amber English, female    DOB: 04-15-1956, 66 y.o.   MRN: [id number]  Diabetes Pertinent negatives for hypoglycemia include no dizziness, headaches or nervousness/anxiousness. Pertinent negatives for diabetes include no chest pain.    Amber English is a very pleasant 66 y.o. female with a history of type 2 diabetes, neuropathy, hyperlipidemia, chronic hip pain who presents today for follow up of chronic conditions.  1) Type 2 Diabetes:   Current medications include: glipizide 5 mg BID, metformin XR 1000 mg BID  She is checking her blood glucose once weekly and is getting readings of 120-135. She does notice drops in glucose once every 2 months, lowest reading has been 70.   Last A1C: 6.5  Last Eye Exam: UTD Last Foot Exam: UTD Pneumonia Vaccination: 2016 Urine Microalbumin: None. Managed on ACE-I Statin: atorvastatin 40 mg daily  Dietary changes since last visit: Fair diet. Finds it difficult to cook for one person.    Exercise: No. Active at home.   2) Hyperlipidemia: Currently managed on atorvastatin 40 mg daily.  Lipid panel from May 2022 with LDL of 88. She is due for repeat lipid panel today.  3) Neuropathy: Currently managed on gabapentin 300 mg 2-3 times daily. Overall feels well managed on this regimen.   4) Chronic Hip Pain/Joint Pain: Currently managed on diclofenac 75 mg once to twice daily as needed. Today she endorses that her hip has not bothered her for 6 months. She has noticed intermittent back and other joint pain, but overall manageable. She is using diclofenac sparingly as needed.   5) Vivid Dreams: Chronic for years, perhaps since 2015.  She has been told by several of her family members that she will yell and talk during her sleep.  She does not typically wake from sleep, but she does recall the dreams that occur pretty much every night.  She denies snoring.  None of her family members have mentioned periods of  apnea.  She typically watches TV before bed and will fall asleep with the TV on.  She will drink caffeine throughout the evening, even before bed.  She admits that she cannot fall asleep without noise in her bedroom, if she is without noise then she will experience mind racing thoughts and anxiety.  She does have a history of chronic anxiety for which she feels is overall well controlled.  She follows with a therapist every 2 weeks.  Wt Readings from Last 3 Encounters:  05/23/22 198 lb (89.8 kg)  11/15/21 196 lb (88.9 kg)  03/28/21 188 lb (85.3 kg)      Review of Systems  Respiratory:  Negative for shortness of breath.   Cardiovascular:  Negative for chest pain.  Gastrointestinal:  Negative for constipation and diarrhea.  Musculoskeletal:  Positive for arthralgias.  Neurological:  Negative for dizziness and headaches.  Psychiatric/Behavioral:  The patient is not nervous/anxious.          Past Medical History:  Diagnosis Date   Diabetes mellitus without complication (HCC)    Hyperlipidemia    Melanoma in situ of scalp (HCC)     Social History   Socioeconomic History   Marital status: Divorced    Spouse name: Not on file   Number of children: Not on file   Years of education: Not on file   Highest education level: Not on file  Occupational History   Not on file  Tobacco Use   Smoking status: Never  Smokeless tobacco: Never  Vaping Use   Vaping Use: Never used  Substance and Sexual Activity   Alcohol use: Yes    Comment: occasional   Drug use: Not Currently   Sexual activity: Not Currently    Birth control/protection: None  Other Topics Concern   Not on file  Social History Narrative   Not on file   Social Determinants of Health   Financial Resource Strain: Not on file  Food Insecurity: Not on file  Transportation Needs: Not on file  Physical Activity: Not on file  Stress: Not on file  Social Connections: Not on file  Intimate Partner Violence: Not on  file    Past Surgical History:  Procedure Laterality Date   FOOT FRACTURE SURGERY     TOTAL ABDOMINAL HYSTERECTOMY  2022    Family History  Problem Relation Age of Onset   Esophageal cancer Mother    Diabetes Father    Heart disease Father    Diabetes Sister    Diabetes Brother    Heart disease Brother    Heart disease Maternal Grandfather    Diabetes Paternal Grandmother    Cancer Paternal Grandmother    Heart disease Paternal Grandfather    Lung cancer Sister     No Known Allergies  Current Outpatient Medications on File Prior to Visit  Medication Sig Dispense Refill   atorvastatin (LIPITOR) 40 MG tablet Take 1 tablet (40 mg total) by mouth daily. For cholesterol. 90 tablet 3   b complex vitamins capsule Take 1 capsule by mouth daily.     Biotin 82956 MCG TBDP Take by mouth.     calcium-vitamin D (OSCAL WITH D) 500-200 MG-UNIT tablet Take 1 tablet by mouth.     cetirizine (ZYRTEC) 10 MG tablet Take 1 tablet (10 mg total) by mouth daily as needed for allergies. For allergies. 90 tablet 0   diclofenac (VOLTAREN) 75 MG EC tablet Take 1 tablet (75 mg total) by mouth at bedtime as needed for moderate pain. 90 tablet 1   fluorouracil (EFUDEX) 5 % cream Apply topically 2 (two) times daily.     gabapentin (NEURONTIN) 300 MG capsule TAKE 1 CAPSULE (300 MG TOTAL) BY MOUTH 3 (THREE) TIMES DAILY. FOR NEUROPATHY. 270 capsule 0   glipiZIDE (GLUCOTROL) 5 MG tablet TAKE 1 TABLET (5 MG TOTAL) BY MOUTH 2 (TWO) TIMES DAILY BEFORE A MEAL. FOR DIABETES. 180 tablet 0   lisinopril (ZESTRIL) 2.5 MG tablet TAKE 1 TABLET (2.5 MG TOTAL) BY MOUTH DAILY. FOR KIDNEY PROTECTION. 90 tablet 0   metFORMIN (GLUCOPHAGE-XR) 500 MG 24 hr tablet Take 2 tablets (1,000 mg total) by mouth 2 (two) times daily with a meal. for diabetes. 360 tablet 1   Multiple Vitamin (MULTIVITAMIN) capsule Take 1 capsule by mouth daily.     triamcinolone cream (KENALOG) 0.1 % Apply topically 2 (two) times daily.     No current  facility-administered medications on file prior to visit.    BP 124/72   Pulse 81   Temp 98.6 F (37 C) (Oral)   Ht 5' 6.75" (1.695 m)   Wt 198 lb (89.8 kg)   SpO2 97%   BMI 31.24 kg/m  Objective:   Physical Exam Cardiovascular:     Rate and Rhythm: Normal rate and regular rhythm.  Pulmonary:     Effort: Pulmonary effort is normal.     Breath sounds: Normal breath sounds.  Abdominal:     General: Bowel sounds are normal.  Palpations: Abdomen is soft.     Tenderness: There is no abdominal tenderness.  Musculoskeletal:     Cervical back: Neck supple.  Skin:    General: Skin is warm and dry.  Psychiatric:        Mood and Affect: Mood normal.           Assessment & Plan:   Problem List Items Addressed This Visit       Endocrine   Type 2 diabetes mellitus with diabetic mononeuropathy, without long-term current use of insulin (HCC) - Primary    Repeat A1C pending.  Continue metformin XR 1000 mg BID, glipizide 5 mg BID for now. Consider Ozempic and stopping glipizide.   Await results.  Managed on statin and ACE-I. Pneumonia vaccine UTD.  Follow up in 6 months      Relevant Orders   Hemoglobin A1c   CBC     Other   HLD (hyperlipidemia)    Continue atorvastatin 40 mg daily. Repeat lipid panel pending.      Relevant Orders   Lipid panel   Comprehensive metabolic panel   CBC   History of melanoma    Following with Dr. Graciela Husbands in [facility]. Continue to monitor precancerous lesions.   Continue Efudex 0.5% cream BID.       Anxiety and depression    Controlled.  Continue with therapy every 2 weeks.  Feels well managed.  Continue to monitor.       Vivid dream    Chronic.  This could be a combination of her anxiety and unhealthy bedtime routine.  We discussed to avoid screen time (including TV/phone/computer) 1 hour before bedtime.  We also discussed to stop caffeine intake at least 4 hours prior to bedtime.  I reviewed a list of her  medications extensively, atorvastatin does have a side effect of nightmares.  Consider holding atorvastatin for 2 weeks to see if this is the cause.            Doreene Nest, NP

## 2022-05-23 NOTE — Assessment & Plan Note (Addendum)
Chronic.  This could be a combination of her anxiety and unhealthy bedtime routine.  We discussed to avoid screen time (including TV/phone/computer) 1 hour before bedtime.  We also discussed to stop caffeine intake at least 4 hours prior to bedtime.  I reviewed a list of her medications extensively, atorvastatin does have a side effect of nightmares.  Consider holding atorvastatin for 2 weeks to see if this is the cause.

## 2022-05-23 NOTE — Patient Instructions (Addendum)
Stop by the lab prior to leaving today. I will notify you of your results once received.   Consider the Shingrix vaccines for shingles prevention.   Please schedule a follow up visit for 6 months for a diabetes check.  It was a pleasure to see you today!

## 2022-05-23 NOTE — Assessment & Plan Note (Signed)
Continue atorvastatin 40 mg daily. Repeat lipid panel pending. 

## 2022-06-06 ENCOUNTER — Ambulatory Visit (INDEPENDENT_AMBULATORY_CARE_PROVIDER_SITE_OTHER): Payer: Medicare Other

## 2022-06-06 VITALS — Ht 66.5 in | Wt 195.0 lb

## 2022-06-06 DIAGNOSIS — Z0000 Encounter for general adult medical examination without abnormal findings: Secondary | ICD-10-CM | POA: Diagnosis not present

## 2022-06-06 NOTE — Progress Notes (Signed)
I connected with Amber English today by telephone and verified that I am speaking with the correct person using two identifiers. Location patient: home Location provider: work Persons participating in the virtual visit: Shantina, Chronister LPN.   I discussed the limitations, risks, security and privacy concerns of performing an evaluation and management service by telephone and the availability of in person appointments. I also discussed with the patient that there may be a patient responsible charge related to this service. The patient expressed understanding and verbally consented to this telephonic visit.    Interactive audio and video telecommunications were attempted between this provider and patient, however failed, due to patient having technical difficulties OR patient did not have access to video capability.  We continued and completed visit with audio only.     Vital signs may be patient reported or missing.  Subjective:   Amber English is a 66 y.o. female who presents for an Initial Medicare Annual Wellness Visit.  Review of Systems     Cardiac Risk Factors include: advanced age (>50men, >45 women);diabetes mellitus;dyslipidemia;obesity (BMI >30kg/m2)     Objective:    Today's Vitals   06/06/22 1058  Weight: 195 lb (88.5 kg)  Height: 5' 6.5" (1.689 m)   Body mass index is 31 kg/m.     06/06/2022   11:04 AM  Advanced Directives  Does Patient Have a Medical Advance Directive? No    Current Medications (verified) Outpatient Encounter Medications as of 06/06/2022  Medication Sig   atorvastatin (LIPITOR) 40 MG tablet Take 1 tablet (40 mg total) by mouth daily. For cholesterol.   b complex vitamins capsule Take 1 capsule by mouth daily.   Biotin 75102 MCG TBDP Take by mouth.   calcium-vitamin D (OSCAL WITH D) 500-200 MG-UNIT tablet Take 1 tablet by mouth.   cetirizine (ZYRTEC) 10 MG tablet Take 1 tablet (10 mg total) by mouth daily as needed for  allergies. For allergies.   diclofenac (VOLTAREN) 75 MG EC tablet Take 1 tablet (75 mg total) by mouth at bedtime as needed for moderate pain.   fluorouracil (EFUDEX) 5 % cream Apply topically 2 (two) times daily.   gabapentin (NEURONTIN) 300 MG capsule TAKE 1 CAPSULE (300 MG TOTAL) BY MOUTH 3 (THREE) TIMES DAILY. FOR NEUROPATHY.   glipiZIDE (GLUCOTROL) 5 MG tablet TAKE 1 TABLET (5 MG TOTAL) BY MOUTH 2 (TWO) TIMES DAILY BEFORE A MEAL. FOR DIABETES.   lisinopril (ZESTRIL) 2.5 MG tablet TAKE 1 TABLET (2.5 MG TOTAL) BY MOUTH DAILY. FOR KIDNEY PROTECTION.   metFORMIN (GLUCOPHAGE-XR) 500 MG 24 hr tablet Take 2 tablets (1,000 mg total) by mouth 2 (two) times daily with a meal. for diabetes.   Multiple Vitamin (MULTIVITAMIN) capsule Take 1 capsule by mouth daily.   triamcinolone cream (KENALOG) 0.1 % Apply topically 2 (two) times daily.   No facility-administered encounter medications on file as of 06/06/2022.    Allergies (verified) Patient has no known allergies.   History: Past Medical History:  Diagnosis Date   Diabetes mellitus without complication (HCC)    Hyperlipidemia    Melanoma in situ of scalp (HCC)    Past Surgical History:  Procedure Laterality Date   FOOT FRACTURE SURGERY     TOTAL ABDOMINAL HYSTERECTOMY  2022   Family History  Problem Relation Age of Onset   Esophageal cancer Mother    Diabetes Father    Heart disease Father    Diabetes Sister    Diabetes Brother    Heart  disease Brother    Heart disease Maternal Grandfather    Diabetes Paternal Grandmother    Cancer Paternal Grandmother    Heart disease Paternal Grandfather    Lung cancer Sister    Social History   Socioeconomic History   Marital status: Divorced    Spouse name: Not on file   Number of children: Not on file   Years of education: Not on file   Highest education level: Not on file  Occupational History   Not on file  Tobacco Use   Smoking status: Never   Smokeless tobacco: Never  Vaping  Use   Vaping Use: Never used  Substance and Sexual Activity   Alcohol use: Yes    Comment: occasional   Drug use: Not Currently   Sexual activity: Not Currently    Birth control/protection: None  Other Topics Concern   Not on file  Social History Narrative   Not on file   Social Determinants of Health   Financial Resource Strain: Low Risk  (06/06/2022)   Overall Financial Resource Strain (CARDIA)    Difficulty of Paying Living Expenses: Not hard at all  Food Insecurity: No Food Insecurity (06/06/2022)   Hunger Vital Sign    Worried About Running Out of Food in the Last Year: Never true    Ran Out of Food in the Last Year: Never true  Transportation Needs: No Transportation Needs (06/06/2022)   PRAPARE - Administrator, Civil ServiceTransportation    Lack of Transportation (Medical): No    Lack of Transportation (Non-Medical): No  Physical Activity: Insufficiently Active (06/06/2022)   Exercise Vital Sign    Days of Exercise per Week: 3 days    Minutes of Exercise per Session: 20 min  Stress: No Stress Concern Present (06/06/2022)   [employer] - Occupational Stress Questionnaire    Feeling of Stress : Not at all  Social Connections: Not on file    Tobacco Counseling Counseling given: Not Answered   Clinical Intake:  Pre-visit preparation completed: Yes  Pain : No/denies pain     Nutritional Status: BMI > 30  Obese Nutritional Risks: None Diabetes: Yes  How often do you need to have someone help you when you read instructions, pamphlets, or other written materials from your doctor or pharmacy?: 1 - Never What is the last grade level you completed in school?: 12th grade  Diabetic? Yes Nutrition Risk Assessment:  Has the patient had any N/V/D within the last 2 months?  No  Does the patient have any non-healing wounds?  No  Has the patient had any unintentional weight loss or weight gain?  No   Diabetes:  Is the patient diabetic?  Yes  If diabetic, was a CBG  obtained today?  No  Did the patient bring in their glucometer from home?  No  How often do you monitor your CBG's? Three times weekly.   Financial Strains and Diabetes Management:  Are you having any financial strains with the device, your supplies or your medication? No .  Does the patient want to be seen by Chronic Care Management for management of their diabetes?  No  Would the patient like to be referred to a Nutritionist or for Diabetic Management?  No   Diabetic Exams:  Diabetic Eye Exam: Completed 04/18/2022 Diabetic Foot Exam: Completed 11/15/2021   Interpreter Needed?: No  Information entered by :: NAllen LPN   Activities of Daily Living    06/06/2022   11:06 AM 05/23/2022  9:25 AM  In your present state of health, do you have any difficulty performing the following activities:  Hearing? 0 0  Vision? 1 0  Comment sometimes   Difficulty concentrating or making decisions? 0 0  Walking or climbing stairs? 0 0  Dressing or bathing? 0 0  Doing errands, shopping? 0 0  Preparing Food and eating ? N   Using the Toilet? N   In the past six months, have you accidently leaked urine? Y   Do you have problems with loss of bowel control? N   Managing your Medications? N   Managing your Finances? N   Housekeeping or managing your Housekeeping? N     Patient Care Team: Doreene Nest, NP as PCP - General (Internal Medicine) [facility]  Indicate any recent Medical Services you may have received from other than [facility] providers in the past year (date may be approximate).     Assessment:   This is a routine wellness examination for Amber English.  Hearing/Vision screen Vision Screening - Comments:: Regular eye exams, [facility]  Dietary issues and exercise activities discussed: Current Exercise Habits: Home exercise routine, Type of exercise: walking, Time (Minutes): 20, Frequency (Times/Week): 3, Weekly Exercise (Minutes/Week): 60   Goals Addressed              This Visit's Progress    Patient Stated       06/06/2022, wants to lose weight and get A1C down       Depression Screen    06/06/2022   11:05 AM 05/23/2022    9:20 AM 03/28/2021   10:37 AM 10/25/2020   11:41 AM  PHQ 2/9 Scores  PHQ - 2 Score 0 2 0 0  PHQ- 9 Score  3 0     Fall Risk    06/06/2022   11:05 AM 05/23/2022    9:25 AM 03/28/2021   10:37 AM  Fall Risk   Falls in the past year? 0 0 0  Number falls in past yr: 0 0 0  Injury with Fall? 0 0 0  Risk for fall due to : Medication side effect    Follow up Falls evaluation completed;Education provided;Falls prevention discussed      FALL RISK PREVENTION PERTAINING TO THE HOME:  Any stairs in or around the home? Yes  If so, are there any without handrails? No  Home free of loose throw rugs in walkways, pet beds, electrical cords, etc? Yes  Adequate lighting in your home to reduce risk of falls? Yes   ASSISTIVE DEVICES UTILIZED TO PREVENT FALLS:  Life alert? No  Use of a cane, walker or w/c? No  Grab bars in the bathroom? Yes  Shower chair or bench in shower? No  Elevated toilet seat or a handicapped toilet? Yes   TIMED UP AND GO:  Was the test performed? No .       Cognitive Function:        06/06/2022   11:09 AM  6CIT Screen  What Year? 0 points  What month? 0 points  What time? 0 points  Count back from 20 0 points  Months in reverse 0 points  Repeat phrase 0 points  Total Score 0 points    Immunizations Immunization History  Administered Date(s) Administered   Influenza Split 11/18/2012   Influenza, High Dose Seasonal PF 08/17/2021   Influenza, Seasonal, Injecte, Preservative Fre 10/07/2013, 11/24/2014   Influenza,inj,quad, With Preservative 09/14/2015, 08/29/2016   Influenza-Unspecified 08/17/2021   PFIZER(Purple  Top)SARS-COV-2 Vaccination 02/03/2020, 02/24/2020   Pfizer Covid-19 Vaccine Bivalent Booster 56yrs & up 08/17/2021   Pneumococcal Conjugate-13 10/07/2013   Pneumococcal  Polysaccharide-23 11/24/2014   Tdap 04/27/2015   Zoster, Live 11/24/2014    TDAP status: Up to date  Flu Vaccine status: Up to date  Pneumococcal vaccine status: Due, Education has been provided regarding the importance of this vaccine. Advised may receive this vaccine at local pharmacy or Health Dept. Aware to provide a copy of the vaccination record if obtained from local pharmacy or Health Dept. Verbalized acceptance and understanding.  Covid-19 vaccine status: Completed vaccines  Qualifies for Shingles Vaccine? Yes   Zostavax completed Yes   Shingrix Completed?: No.    Education has been provided regarding the importance of this vaccine. Patient has been advised to call insurance company to determine out of pocket expense if they have not yet received this vaccine. Advised may also receive vaccine at local pharmacy or Health Dept. Verbalized acceptance and understanding.  Screening Tests Health Maintenance  Topic Date Due   Hepatitis C Screening  Never done   Zoster Vaccines- Shingrix (1 of 2) Never done   DEXA SCAN  Never done   Pneumonia Vaccine 54+ Years old (3 - PPSV23 or PCV20) 11/25/2019   COVID-19 Vaccine (4 - Pfizer series) 06/08/2022 (Originally 12/18/2021)   INFLUENZA VACCINE  06/12/2022   FOOT EXAM  11/15/2022   HEMOGLOBIN A1C  11/23/2022   Fecal DNA (Cologuard)  12/20/2022   OPHTHALMOLOGY EXAM  04/19/2023   MAMMOGRAM  09/30/2023   TETANUS/TDAP  04/26/2025   HPV VACCINES  Aged Out    Health Maintenance  Health Maintenance Due  Topic Date Due   Hepatitis C Screening  Never done   Zoster Vaccines- Shingrix (1 of 2) Never done   DEXA SCAN  Never done   Pneumonia Vaccine 19+ Years old (3 - PPSV23 or PCV20) 11/25/2019    Colorectal cancer screening: Type of screening: Cologuard. Completed 12/21/2019. Repeat every 3 years  Mammogram status: Completed 09/29/2021. Repeat every year  Bone Density status: Completed 2020.   Lung Cancer Screening: (Low Dose CT Chest  recommended if Age 81-80 years, 30 pack-year currently smoking OR have quit w/in 15years.) does not qualify.   Lung Cancer Screening Referral: no  Additional Screening:  Hepatitis C Screening: does qualify;   Vision Screening: Recommended annual ophthalmology exams for early detection of glaucoma and other disorders of the eye. Is the patient up to date with their annual eye exam?  Yes  Who is the provider or what is the name of the office in which the patient attends annual eye exams? [facility] If pt is not established with a provider, would they like to be referred to a provider to establish care? No .   Dental Screening: Recommended annual dental exams for proper oral hygiene  Community Resource Referral / Chronic Care Management: CRR required this visit?  No   CCM required this visit?  No      Plan:     I have personally reviewed and noted the following in the patient's chart:   Medical and social history Use of alcohol, tobacco or illicit drugs  Current medications and supplements including opioid prescriptions. Patient is not currently taking opioid prescriptions. Functional ability and status Nutritional status Physical activity Advanced directives List of other physicians Hospitalizations, surgeries, and ER visits in previous 12 months Vitals Screenings to include cognitive, depression, and falls Referrals and appointments  In addition, I have reviewed and  discussed with patient certain preventive protocols, quality metrics, and best practice recommendations. A written personalized care plan for preventive services as well as general preventive health recommendations were provided to patient.     Barb Merino, LPN   [id number]   Nurse Notes: none  Due to this being a virtual visit, the after visit summary with patients personalized plan was offered to patient via mail or my-chart.  Patient would like to access on my-chart

## 2022-06-06 NOTE — Patient Instructions (Signed)
Ms. Castronova , Thank you for taking time to come for your Medicare Wellness Visit. I appreciate your ongoing commitment to your health goals. Please review the following plan we discussed and let me know if I can assist you in the future.   Screening recommendations/referrals: Colonoscopy: cologuard 12/21/2019, due 12/20/2022 Mammogram: completed 09/29/2021, due 09/30/2022 Bone Density: completed 2020 per patient Recommended yearly ophthalmology/optometry visit for glaucoma screening and checkup Recommended yearly dental visit for hygiene and checkup  Vaccinations: Influenza vaccine: due 06/12/2022 Pneumococcal vaccine: due Tdap vaccine: completed 04/27/2015, due 04/26/2025 Shingles vaccine: discussed   Covid-19: 08/17/2021, 02/24/2020, 02/03/2020  Advanced directives: Advance directive discussed with you today.   Conditions/risks identified: none  Next appointment: Follow up in one year for your annual wellness visit    Preventive Care 65 Years and Older, Female Preventive care refers to lifestyle choices and visits with your health care provider that can promote health and wellness. What does preventive care include? A yearly physical exam. This is also called an annual well check. Dental exams once or twice a year. Routine eye exams. Ask your health care provider how often you should have your eyes checked. Personal lifestyle choices, including: Daily care of your teeth and gums. Regular physical activity. Eating a healthy diet. Avoiding tobacco and drug use. Limiting alcohol use. Practicing safe sex. Taking low-dose aspirin every day. Taking vitamin and mineral supplements as recommended by your health care provider. What happens during an annual well check? The services and screenings done by your health care provider during your annual well check will depend on your age, overall health, lifestyle risk factors, and family history of disease. Counseling  Your health care provider  may ask you questions about your: Alcohol use. Tobacco use. Drug use. Emotional well-being. Home and relationship well-being. Sexual activity. Eating habits. History of falls. Memory and ability to understand (cognition). Work and work Astronomer. Reproductive health. Screening  You may have the following tests or measurements: Height, weight, and BMI. Blood pressure. Lipid and cholesterol levels. These may be checked every 5 years, or more frequently if you are over 101 years old. Skin check. Lung cancer screening. You may have this screening every year starting at age 23 if you have a 30-pack-year history of smoking and currently smoke or have quit within the past 15 years. Fecal occult blood test (FOBT) of the stool. You may have this test every year starting at age 54. Flexible sigmoidoscopy or colonoscopy. You may have a sigmoidoscopy every 5 years or a colonoscopy every 10 years starting at age 90. Hepatitis C blood test. Hepatitis B blood test. Sexually transmitted disease (STD) testing. Diabetes screening. This is done by checking your blood sugar (glucose) after you have not eaten for a while (fasting). You may have this done every 1-3 years. Bone density scan. This is done to screen for osteoporosis. You may have this done starting at age 17. Mammogram. This may be done every 1-2 years. Talk to your health care provider about how often you should have regular mammograms. Talk with your health care provider about your test results, treatment options, and if necessary, the need for more tests. Vaccines  Your health care provider may recommend certain vaccines, such as: Influenza vaccine. This is recommended every year. Tetanus, diphtheria, and acellular pertussis (Tdap, Td) vaccine. You may need a Td booster every 10 years. Zoster vaccine. You may need this after age 80. Pneumococcal 13-valent conjugate (PCV13) vaccine. One dose is recommended after age 57. Pneumococcal  polysaccharide (PPSV23) vaccine. One dose is recommended after age 30. Talk to your health care provider about which screenings and vaccines you need and how often you need them. This information is not intended to replace advice given to you by your health care provider. Make sure you discuss any questions you have with your health care provider. Document Released: 11/25/2015 Document Revised: 07/18/2016 Document Reviewed: 08/30/2015 Elsevier Interactive Patient Education  2017 [employer] Prevention in the Home Falls can cause injuries. They can happen to people of all ages. There are many things you can do to make your home safe and to help prevent falls. What can I do on the outside of my home? Regularly fix the edges of walkways and driveways and fix any cracks. Remove anything that might make you trip as you walk through a door, such as a raised step or threshold. Trim any bushes or trees on the path to your home. Use bright outdoor lighting. Clear any walking paths of anything that might make someone trip, such as rocks or tools. Regularly check to see if handrails are loose or broken. Make sure that both sides of any steps have handrails. Any raised decks and porches should have guardrails on the edges. Have any leaves, snow, or ice cleared regularly. Use sand or salt on walking paths during winter. Clean up any spills in your garage right away. This includes oil or grease spills. What can I do in the bathroom? Use night lights. Install grab bars by the toilet and in the tub and shower. Do not use towel bars as grab bars. Use non-skid mats or decals in the tub or shower. If you need to sit down in the shower, use a plastic, non-slip stool. Keep the floor dry. Clean up any water that spills on the floor as soon as it happens. Remove soap buildup in the tub or shower regularly. Attach bath mats securely with double-sided non-slip rug tape. Do not have throw rugs and other  things on the floor that can make you trip. What can I do in the bedroom? Use night lights. Make sure that you have a light by your bed that is easy to reach. Do not use any sheets or blankets that are too big for your bed. They should not hang down onto the floor. Have a firm chair that has side arms. You can use this for support while you get dressed. Do not have throw rugs and other things on the floor that can make you trip. What can I do in the kitchen? Clean up any spills right away. Avoid walking on wet floors. Keep items that you use a lot in easy-to-reach places. If you need to reach something above you, use a strong step stool that has a grab bar. Keep electrical cords out of the way. Do not use floor polish or wax that makes floors slippery. If you must use wax, use non-skid floor wax. Do not have throw rugs and other things on the floor that can make you trip. What can I do with my stairs? Do not leave any items on the stairs. Make sure that there are handrails on both sides of the stairs and use them. Fix handrails that are broken or loose. Make sure that handrails are as long as the stairways. Check any carpeting to make sure that it is firmly attached to the stairs. Fix any carpet that is loose or worn. Avoid having throw rugs at the top or  bottom of the stairs. If you do have throw rugs, attach them to the floor with carpet tape. Make sure that you have a light switch at the top of the stairs and the bottom of the stairs. If you do not have them, ask someone to add them for you. What else can I do to help prevent falls? Wear shoes that: Do not have high heels. Have rubber bottoms. Are comfortable and fit you well. Are closed at the toe. Do not wear sandals. If you use a stepladder: Make sure that it is fully opened. Do not climb a closed stepladder. Make sure that both sides of the stepladder are locked into place. Ask someone to hold it for you, if possible. Clearly  mark and make sure that you can see: Any grab bars or handrails. First and last steps. Where the edge of each step is. Use tools that help you move around (mobility aids) if they are needed. These include: Canes. Walkers. Scooters. Crutches. Turn on the lights when you go into a dark area. Replace any light bulbs as soon as they burn out. Set up your furniture so you have a clear path. Avoid moving your furniture around. If any of your floors are uneven, fix them. If there are any pets around you, be aware of where they are. Review your medicines with your doctor. Some medicines can make you feel dizzy. This can increase your chance of falling. Ask your doctor what other things that you can do to help prevent falls. This information is not intended to replace advice given to you by your health care provider. Make sure you discuss any questions you have with your health care provider. Document Released: 08/25/2009 Document Revised: 04/05/2016 Document Reviewed: 12/03/2014 Elsevier Interactive Patient Education  2017 [employer].

## 2022-07-29 ENCOUNTER — Other Ambulatory Visit: Payer: Self-pay | Admitting: Primary Care

## 2022-07-29 DIAGNOSIS — E1141 Type 2 diabetes mellitus with diabetic mononeuropathy: Secondary | ICD-10-CM

## 2022-09-18 ENCOUNTER — Other Ambulatory Visit: Payer: Self-pay | Admitting: Primary Care

## 2022-09-18 DIAGNOSIS — E7800 Pure hypercholesterolemia, unspecified: Secondary | ICD-10-CM

## 2022-11-26 ENCOUNTER — Other Ambulatory Visit: Payer: Self-pay | Admitting: Primary Care

## 2022-11-26 DIAGNOSIS — E1141 Type 2 diabetes mellitus with diabetic mononeuropathy: Secondary | ICD-10-CM

## 2022-11-27 ENCOUNTER — Encounter: Payer: Self-pay | Admitting: Primary Care

## 2022-11-27 ENCOUNTER — Ambulatory Visit (INDEPENDENT_AMBULATORY_CARE_PROVIDER_SITE_OTHER): Payer: Medicare Other | Admitting: Primary Care

## 2022-11-27 VITALS — BP 106/64 | HR 73 | Temp 97.9°F | Ht 66.5 in | Wt 200.0 lb

## 2022-11-27 DIAGNOSIS — Z1231 Encounter for screening mammogram for malignant neoplasm of breast: Secondary | ICD-10-CM | POA: Diagnosis not present

## 2022-11-27 DIAGNOSIS — H9193 Unspecified hearing loss, bilateral: Secondary | ICD-10-CM

## 2022-11-27 DIAGNOSIS — F419 Anxiety disorder, unspecified: Secondary | ICD-10-CM

## 2022-11-27 DIAGNOSIS — G8929 Other chronic pain: Secondary | ICD-10-CM

## 2022-11-27 DIAGNOSIS — E7800 Pure hypercholesterolemia, unspecified: Secondary | ICD-10-CM | POA: Diagnosis not present

## 2022-11-27 DIAGNOSIS — F32A Depression, unspecified: Secondary | ICD-10-CM

## 2022-11-27 DIAGNOSIS — E2839 Other primary ovarian failure: Secondary | ICD-10-CM

## 2022-11-27 DIAGNOSIS — E1141 Type 2 diabetes mellitus with diabetic mononeuropathy: Secondary | ICD-10-CM | POA: Diagnosis not present

## 2022-11-27 DIAGNOSIS — R6889 Other general symptoms and signs: Secondary | ICD-10-CM

## 2022-11-27 DIAGNOSIS — M25551 Pain in right hip: Secondary | ICD-10-CM

## 2022-11-27 LAB — LIPID PANEL
Cholesterol: 155 mg/dL (ref 0–200)
HDL: 51.6 mg/dL (ref >39.00)
LDL Cholesterol: 83 mg/dL (ref 0–99)
NonHDL: 103.38
Total CHOL/HDL Ratio: 3
Triglycerides: 101 mg/dL (ref 0.0–149.0)
VLDL: 20.2 mg/dL (ref 0.0–40.0)

## 2022-11-27 LAB — BASIC METABOLIC PANEL
BUN: 13 mg/dL (ref 6–23)
CO2: 28 mEq/L (ref 19–32)
Calcium: 9.4 mg/dL (ref 8.4–10.5)
Chloride: 104 mEq/L (ref 96–112)
Creatinine, Ser: 0.85 mg/dL (ref 0.40–1.20)
GFR: 70.49 mL/min (ref >60.00)
Glucose, Bld: 191 mg/dL — ABNORMAL HIGH (ref 70–99)
Potassium: 4.7 mEq/L (ref 3.5–5.1)
Sodium: 139 mEq/L (ref 135–145)

## 2022-11-27 LAB — POCT GLYCOSYLATED HEMOGLOBIN (HGB A1C): Hemoglobin A1C: 6.9 % — AB (ref 4.0–5.6)

## 2022-11-27 LAB — MICROALBUMIN / CREATININE URINE RATIO
Creatinine,U: 92.9 mg/dL
Microalb Creat Ratio: 0.9 mg/g (ref 0.0–30.0)
Microalb, Ur: 0.8 mg/dL (ref 0.0–1.9)

## 2022-11-27 MED ORDER — OZEMPIC (0.25 OR 0.5 MG/DOSE) 2 MG/3ML ~~LOC~~ SOPN: PEN_INJECTOR | SUBCUTANEOUS | 0 refills | Status: DC

## 2022-11-27 MED ORDER — TRAZODONE HCL 50 MG PO TABS: 50.0000 mg | ORAL_TABLET | Freq: Every day | ORAL | 0 refills | Status: DC

## 2022-11-27 NOTE — Assessment & Plan Note (Signed)
Improved and controlled!  Continue diclofenac 75 mg PRN. Continue gabapentin 300 mg BID-TID daily.

## 2022-11-27 NOTE — Assessment & Plan Note (Addendum)
Improved and controlled with A1C of 6.9 today. Would like to see A1C <6.5, she agrees.  Continue metformin XR 1000 mg BID. Start semaglutide (Ozempic) for diabetes/weight loss. Start by injecting 0.25 mg into the skin once weekly for 4 weeks, then increase to 0.5 mg once weekly thereafter.   Continue gabapentin 300 mg BID to TID.  Stop Glipizide once she begins the 0.5 mg dose of Ozempic.  Foot exam today. Urine microalbumin due and pending.  Follow up in 6 months.

## 2022-11-27 NOTE — Assessment & Plan Note (Signed)
Controlled.  Continue every other week therapy sessions.

## 2022-11-27 NOTE — Patient Instructions (Addendum)
Start semaglutide (Ozempic) for diabetes/weight loss. Start by injecting 0.25 mg into the skin once weekly for 4 weeks, then increase to 0.5 mg once weekly thereafter.   Stop Glipizide after you start the 0.5 mg dose.   Start Trazodone at bedtime for sleep. Take about 30-60 minutes prior to bedtime.  Consider the colonoscopy for colon cancer screening and your stomach symptoms. You are due for colon cancer screening now.  [facility] and bone density scan.  Please update me regarding your sleep/dreams and your blood sugars.  Please schedule a follow up visit for 6 months for a diabetes check.  It was a pleasure to see you today!

## 2022-11-27 NOTE — Assessment & Plan Note (Signed)
Continue atorvastatin 40 mg daily. Repeat lipid panel pending. 

## 2022-11-27 NOTE — Assessment & Plan Note (Signed)
Unclear etiology, especially since symptoms began during childhood.  Less likely medication side effect, but after reading further, atorvastatin could exacerbate dreams.  Trial of Trazodone 50 mg HS. She will update.  Consider holding atorvastatin x 2 weeks.

## 2022-11-27 NOTE — Assessment & Plan Note (Signed)
Bilateral cerumen impaction identified on exam. Patient consented to irrigation of canals bilaterally.  Bilateral canals irrigated. Patient tolerated well. TM's and canals post irrigation unremarkable.   Discussed home care instructions.   

## 2022-11-27 NOTE — Progress Notes (Signed)
Subjective:    Patient ID: Amber English, female    DOB: 18-Feb-1956, 67 y.o.   MRN: [id number]  HPI  Amber English is a very pleasant 67 y.o. female  has a past medical history of Diabetes mellitus without complication (HCC), Hyperlipidemia, and Melanoma in situ of scalp (HCC). who presents today for follow up of chronic conditions.   Immunizations: -Tetanus: Completed in 2016 -Influenza: Completed today -Shingles: Completed Zostavax, never completed Shingrix. -Pneumonia: Completed Prevnar 13 in 2014, pneumovax in 2016  Mammogram: Completed in December 2022  Colonoscopy: Completed Cologuard in February 2021, last Colonoscopy was years ago.  Dexa: Completed years ago.    1) Type 2 Diabetes:  Current medications include: Glipizide 5 mg BID, metformin XR 1000 mg BID.   She is checking her blood glucose 1 times daily and is getting readings of:  AM fasting 120's-160's.   Last A1C: 7.1 in July 2023, 6.9 today Last Eye Exam: UTD Last Foot Exam: Due Pneumonia Vaccination:UTD Urine Microalbumin: Due Statin: atorvastatin   Dietary changes since last visit: Changes in taste over the last 6-9 months. Increased consumption of nuts. Odd abdominal sensation with noise.    Exercise: None.   Wt Readings from Last 3 Encounters:  11/27/22 200 lb (90.7 kg)  06/06/22 195 lb (88.5 kg)  05/23/22 198 lb (89.8 kg)     2) Hyperlipidemia: Currently managed on atorvastatin 40 mg. Last lipid panel was completed in July 2023 with LDL of 83. She continues to notice vivid dreams. She would like her lipids rechecked today.   3) Chronic Hip Pain: Currently managed on gabapentin 300 mg BID and TID, and diclofenac 75 mg BID PRN.   Overall doing very well on her current regimen, taking diclofenac about 1-2 times monthly. Gabapentin dose not cause drowsiness.   4) Digestive Changes: Chronic and intermittent, noticed to the bilateral mid abdomen without pain or bloating, but with  increased noises and changes in taste. Occurs with specific foods, especially greasy food (pizza), chips, cheetos. She's also noticed a change in her taste over the 6-9 months. She can tolerate peppers and onions.   She's been taking Metamucil BID for the last few weeks due to constipation, this has helped. She has also been taking magnesium at bedtime for sleep.  She can tolerate beef and pork well. She denies abdominal pain, diarrhea, nausea/vomiting, significant changes in her diet.   5) Vivid Dreams: Chronic, nightly, vivid dreams. Typical bedtime routine includes watching TV before bed and falling asleep with the TV on. She does not drink caffeine after 7 pm which is a recent change. She will get tired around 12 am, falls asleep within minutes, no difficulty falling asleep. She will wake around 8 am, sometimes feels tired, sometimes not.   She sometimes snores, but not consistently. Her vivid dreams will sometimes wakes her during the night. She has been told by numerous people that she talks in her sleep every night. She will wake herself up screaming at times.   Symptoms date back to childhood, but over the last few years her symptoms have progressed. She follows with therapy every other week for chronic anxiety/depression, feels well managed. She has mentioned her vivid dreams to her therapist.   She has never tried anything Rx for sleep, is interested in doing so. She has tried Melatonin which does help to calm her at night when feeling anxiety but continues to experience vivid dreams.   Review of Systems  Respiratory:  Negative for shortness of breath.   Cardiovascular:  Negative for chest pain.  Musculoskeletal:  Negative for arthralgias.  Neurological:  Positive for numbness. Negative for dizziness.       Numbness improved.  Psychiatric/Behavioral:  Positive for sleep disturbance. The patient is not nervous/anxious.          Past Medical History:  Diagnosis Date   Diabetes  mellitus without complication ([place])    Hyperlipidemia    Melanoma in situ of scalp ([place])     Social History   Socioeconomic History   Marital status: Divorced    Spouse name: Not on file   Number of children: Not on file   Years of education: Not on file   Highest education level: Not on file  Occupational History   Not on file  Tobacco Use   Smoking status: Never   Smokeless tobacco: Never  Vaping Use   Vaping Use: Never used  Substance and Sexual Activity   Alcohol use: Yes    Comment: occasional   Drug use: Not Currently   Sexual activity: Not Currently    Birth control/protection: None  Other Topics Concern   Not on file  Social History Narrative   Not on file   Social Determinants of Health   Financial Resource Strain: Low Risk  (06/06/2022)   Overall Financial Resource Strain (CARDIA)    Difficulty of Paying Living Expenses: Not hard at all  Food Insecurity: No Food Insecurity (06/06/2022)   Hunger Vital Sign    Worried About Running Out of Food in the Last Year: Never true    [place] in the Last Year: Never true  Transportation Needs: No Transportation Needs (06/06/2022)   PRAPARE - Hydrologist (Medical): No    Lack of Transportation (Non-Medical): No  Physical Activity: Insufficiently Active (06/06/2022)   Exercise Vital Sign    Days of Exercise per Week: 3 days    Minutes of Exercise per Session: 20 min  Stress: No Stress Concern Present (06/06/2022)   [place]    Feeling of Stress : Not at all  Social Connections: Not on file  Intimate Partner Violence: Not on file    Past Surgical History:  Procedure Laterality Date   FOOT FRACTURE SURGERY     TOTAL ABDOMINAL HYSTERECTOMY  2022    Family History  Problem Relation Age of Onset   Esophageal cancer Mother    Diabetes Father    Heart disease Father    Diabetes Sister    Diabetes Brother     Heart disease Brother    Heart disease Maternal Grandfather    Diabetes Paternal Grandmother    Cancer Paternal Grandmother    Heart disease Paternal Grandfather    Lung cancer Sister     No Known Allergies  Current Outpatient Medications on File Prior to Visit  Medication Sig Dispense Refill   atorvastatin (LIPITOR) 40 MG tablet TAKE 1 TABLET BY MOUTH EVERY DAY FOR CHOLESTEROL 90 tablet 2   b complex vitamins capsule Take 1 capsule by mouth daily.     calcium-vitamin D (OSCAL WITH D) 500-200 MG-UNIT tablet Take 1 tablet by mouth.     cetirizine (ZYRTEC) 10 MG tablet Take 1 tablet (10 mg total) by mouth daily as needed for allergies. For allergies. 90 tablet 0   diclofenac (VOLTAREN) 75 MG EC tablet Take 1 tablet (75 mg total) by mouth at bedtime  as needed for moderate pain. 90 tablet 1   gabapentin (NEURONTIN) 300 MG capsule TAKE 1 CAPSULE (300 MG TOTAL) BY MOUTH 3 (THREE) TIMES DAILY. FOR NEUROPATHY. 270 capsule 0   glipiZIDE (GLUCOTROL) 5 MG tablet TAKE 1 TABLET (5 MG TOTAL) BY MOUTH 2 (TWO) TIMES DAILY BEFORE A MEAL. FOR DIABETES. 180 tablet 1   lisinopril (ZESTRIL) 2.5 MG tablet TAKE 1 TABLET (2.5 MG TOTAL) BY MOUTH DAILY. FOR KIDNEY PROTECTION. 90 tablet 2   metFORMIN (GLUCOPHAGE-XR) 500 MG 24 hr tablet TAKE 2 TABLETS (1,000 MG TOTAL) BY MOUTH 2 (TWO) TIMES DAILY WITH A MEAL. FOR DIABETES. 360 tablet 1   Multiple Vitamin (MULTIVITAMIN) capsule Take 1 capsule by mouth daily.     Biotin 66063 MCG TBDP Take by mouth. (Patient not taking: Reported on 11/27/2022)     fluorouracil (EFUDEX) 5 % cream Apply topically 2 (two) times daily. (Patient not taking: Reported on 11/27/2022)     triamcinolone cream (KENALOG) 0.1 % Apply topically 2 (two) times daily. (Patient not taking: Reported on 11/27/2022)     No current facility-administered medications on file prior to visit.    BP 106/64   Pulse 73   Temp 97.9 F (36.6 C) (Temporal)   Ht 5' 6.5" (1.689 m)   Wt 200 lb (90.7 kg)   SpO2 99%    BMI 31.80 kg/m  Objective:   Physical Exam HENT:     Right Ear: There is impacted cerumen.     Left Ear: There is impacted cerumen.  Cardiovascular:     Rate and Rhythm: Normal rate and regular rhythm.  Pulmonary:     Effort: Pulmonary effort is normal.     Breath sounds: Normal breath sounds.  Musculoskeletal:     Cervical back: Neck supple.  Skin:    General: Skin is warm and dry.  Neurological:     Mental Status: She is alert and oriented to person, place, and time.  Psychiatric:        Mood and Affect: Mood normal.           Assessment & Plan:  Type 2 diabetes mellitus with diabetic mononeuropathy, without long-term current use of insulin (HCC) Assessment & Plan: Improved and controlled with A1C of 6.9 today. Would like to see A1C <6.5, she agrees.  Continue metformin XR 1000 mg BID. Start semaglutide (Ozempic) for diabetes/weight loss. Start by injecting 0.25 mg into the skin once weekly for 4 weeks, then increase to 0.5 mg once weekly thereafter.   Continue gabapentin 300 mg BID to TID.  Stop Glipizide once she begins the 0.5 mg dose of Ozempic.  Foot exam today. Urine microalbumin due and pending.  Follow up in 6 months.  Orders: -     Microalbumin / creatinine urine ratio -     POCT glycosylated hemoglobin (Hb A1C) -     Ozempic (0.25 or 0.5 MG/DOSE); Start by injecting 0.25 mg into the skin once weekly for 4 weeks, then increase to 0.5 mg once weekly thereafter for diabetes.  Dispense: 9 mL; Refill: 0  Estrogen deficiency -     DG Bone Density; Future  Screening mammogram for breast cancer -     3D Screening Mammogram, Left and Right; Future  Vivid dream Assessment & Plan: Unclear etiology, especially since symptoms began during childhood.  Less likely medication side effect, but after reading further, atorvastatin could exacerbate dreams.  Trial of Trazodone 50 mg HS. She will update.  Consider holding atorvastatin x  2 weeks.   Orders: -      traZODone HCl; Take 1 tablet (50 mg total) by mouth at bedtime. For sleep  Dispense: 90 tablet; Refill: 0  Pure hypercholesterolemia Assessment & Plan: Continue atorvastatin 40 mg daily. Repeat lipid panel pending.  Orders: -     Lipid panel -     Basic metabolic panel  Anxiety and depression Assessment & Plan: Controlled.  Continue every other week therapy sessions.   Chronic pain of right hip Assessment & Plan: Improved and controlled!  Continue diclofenac 75 mg PRN. Continue gabapentin 300 mg BID-TID daily.   Decreased hearing of both ears Assessment & Plan: Bilateral cerumen impaction identified on exam. Patient consented to irrigation of canals bilaterally.  Bilateral canals irrigated. Patient tolerated well. TM's and canals post irrigation unremarkable.   Discussed home care instructions.           Pleas Koch, NP

## 2022-11-29 ENCOUNTER — Encounter: Payer: Self-pay | Admitting: Primary Care

## 2023-01-07 DIAGNOSIS — E1141 Type 2 diabetes mellitus with diabetic mononeuropathy: Secondary | ICD-10-CM

## 2023-01-07 MED ORDER — GLIPIZIDE 10 MG PO TABS: 10.0000 mg | ORAL_TABLET | Freq: Two times a day (BID) | ORAL | 1 refills | Status: DC

## 2023-01-09 ENCOUNTER — Ambulatory Visit
Admission: RE | Admit: 2023-01-09 | Discharge: 2023-01-09 | Disposition: A | Discharge status: Home / Self Care | Payer: Medicare Other | Source: Ambulatory Visit | Attending: Primary Care | Admitting: Primary Care

## 2023-01-09 DIAGNOSIS — Z1231 Encounter for screening mammogram for malignant neoplasm of breast: Secondary | ICD-10-CM

## 2023-01-09 DIAGNOSIS — E2839 Other primary ovarian failure: Secondary | ICD-10-CM

## 2023-03-03 ENCOUNTER — Other Ambulatory Visit: Payer: Self-pay | Admitting: Primary Care

## 2023-03-03 DIAGNOSIS — R6889 Other general symptoms and signs: Secondary | ICD-10-CM

## 2023-05-05 ENCOUNTER — Other Ambulatory Visit: Payer: Self-pay | Admitting: Primary Care

## 2023-05-05 DIAGNOSIS — E1141 Type 2 diabetes mellitus with diabetic mononeuropathy: Secondary | ICD-10-CM

## 2023-05-22 DIAGNOSIS — I83893 Varicose veins of bilateral lower extremities with other complications: Secondary | ICD-10-CM

## 2023-05-28 ENCOUNTER — Ambulatory Visit: Payer: Medicare Other | Admitting: Primary Care

## 2023-06-05 ENCOUNTER — Other Ambulatory Visit: Payer: Self-pay | Admitting: Primary Care

## 2023-06-05 DIAGNOSIS — R6889 Other general symptoms and signs: Secondary | ICD-10-CM

## 2023-06-12 ENCOUNTER — Ambulatory Visit (INDEPENDENT_AMBULATORY_CARE_PROVIDER_SITE_OTHER): Payer: Medicare Other | Admitting: Primary Care

## 2023-06-12 ENCOUNTER — Encounter: Payer: Self-pay | Admitting: Primary Care

## 2023-06-12 VITALS — BP 112/64 | HR 70 | Temp 97.1°F | Ht 66.5 in | Wt 198.0 lb

## 2023-06-12 DIAGNOSIS — Z7984 Long term (current) use of oral hypoglycemic drugs: Secondary | ICD-10-CM

## 2023-06-12 DIAGNOSIS — R202 Paresthesia of skin: Secondary | ICD-10-CM | POA: Diagnosis not present

## 2023-06-12 DIAGNOSIS — E1141 Type 2 diabetes mellitus with diabetic mononeuropathy: Secondary | ICD-10-CM | POA: Diagnosis not present

## 2023-06-12 DIAGNOSIS — H9193 Unspecified hearing loss, bilateral: Secondary | ICD-10-CM | POA: Diagnosis not present

## 2023-06-12 DIAGNOSIS — Z23 Encounter for immunization: Secondary | ICD-10-CM

## 2023-06-12 LAB — POCT GLYCOSYLATED HEMOGLOBIN (HGB A1C): Hemoglobin A1C: 6.5 % — AB (ref 4.0–5.6)

## 2023-06-12 NOTE — Assessment & Plan Note (Addendum)
Improved and controlled with A1C of 6.5 today.  Continue metformin XR 1000 mg BID, glipizide 10 mg BID.   Continue gabapentin 300 mg. She will alter the time she is taking.  She will schedule eye exam.  Follow up in 6 months.

## 2023-06-12 NOTE — Assessment & Plan Note (Signed)
Symptoms suggestive of carpal tunnel.  She will start by wearing a carpal tunnel brace at night. Consider referral to orthopedics.  She will update.

## 2023-06-12 NOTE — Patient Instructions (Signed)
Try wearing a carpal tunnel brace at night as discussed.   Continue to work on [employer].   Please schedule a follow up visit for 6 months for follow up.  It was a pleasure to see you today!

## 2023-06-12 NOTE — Assessment & Plan Note (Signed)
Ear exam today without cerumen impaction. See results of hearing test in the office.

## 2023-06-12 NOTE — Progress Notes (Signed)
Subjective:    Patient ID: Amber English, female    DOB: 11-11-1956, 67 y.o.   MRN: [id number]  HPI  Amber English is a very pleasant 67 y.o. female with a history of type 2 diabetes, hyperlipidemia, chronic hip pain, anxiety and depression who presents today for follow-up of diabetes and to discuss paresthesias.   1) Type 2 Diabetes: Current medications include: Metformin XR 1000 mg twice daily, glipizide 10 mg twice daily.  Her insurance would not cover Ozempic.  She is checking her blood glucose 1 times daily and is getting readings of:  AM fasting: low 100's mostly  Last A1C: 6.9 in January 2024, 6.5 today Last Eye Exam: Due, she will schedule  Last Foot Exam: Up-to-date Pneumonia Vaccination: Last completed in 2016 Urine Microalbumin: Up-to-date Statin: Atorvastatin  Dietary changes since last visit: Home cooked meals.   Exercise: Walking some weather permitted   BP Readings from Last 3 Encounters:  06/12/23 112/64  11/27/22 106/64  05/23/22 124/72   Wt Readings from Last 3 Encounters:  06/12/23 198 lb (89.8 kg)  11/27/22 200 lb (90.7 kg)  06/06/22 195 lb (88.5 kg)   2) Paresthesias: Chronic to bilateral hands, mostly to right hand with radiation to right mid forearm. Symptoms occur each morning when first waking, lasts for about 30 minutes, has to shake her hands, then symptoms abate.   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 She does not wear a splint or brace. She has not been formally diagnosed with carpal tunnel syndrome.   She denies neck pain, shoulder pain.   She has noticed increased tingling/neuropathic pain at night in her feet. Lasts for about 20-30 minutes after getting into bed. She takes gabapentin 300 mg BID.    Review of Systems  Respiratory:  Negative for shortness of breath.   Cardiovascular:  Negative for chest pain.  Skin:  Negative for color change.  Neurological:  Positive for numbness.          Past Medical History:  Diagnosis Date   Diabetes mellitus without complication (HCC)    Hyperlipidemia    Melanoma in situ of scalp (HCC)     Social History   Socioeconomic History   Marital status: Divorced    Spouse name: Not on file   Number of children: Not on file   Years of education: Not on file   Highest education level: Not on file  Occupational History   Not on file  Tobacco Use   Smoking status: Never   Smokeless tobacco: Never  Vaping Use   Vaping status: Never Used  Substance and Sexual Activity   Alcohol use: Yes    Comment: occasional   Drug use: Not Currently   Sexual activity: Not Currently    Birth control/protection: None  Other Topics Concern   Not on file  Social History Narrative   Not on file   Social Determinants of Health   Financial Resource Strain: Low Risk  (06/06/2022)   Overall Financial Resource Strain (CARDIA)    Difficulty of Paying Living Expenses: Not hard at all  Food Insecurity: No Food Insecurity (06/06/2022)   Hunger Vital Sign    Worried About Running Out of Food in the Last Year: Never true    Ran Out of Food in the Last Year: Never true  Transportation Needs: No Transportation Needs (06/06/2022)   PRAPARE - Transportation    Lack of Transportation (Medical): No    Lack  of Transportation (Non-Medical): No  Physical Activity: Insufficiently Active (06/06/2022)   Exercise Vital Sign    Days of Exercise per Week: 3 days    Minutes of Exercise per Session: 20 min  Stress: No Stress Concern Present (06/06/2022)   [employer] of Occupational Health - Occupational Stress Questionnaire    Feeling of Stress : Not at all  Social Connections: Not on file  Intimate Partner Violence: Not on file    Past Surgical History:  Procedure Laterality Date   FOOT FRACTURE SURGERY     TOTAL ABDOMINAL HYSTERECTOMY  2022    Family History  Problem Relation Age of Onset   Esophageal cancer Mother    Diabetes Father    Heart  disease Father    Diabetes Sister    Lung cancer Sister    Heart disease Maternal Grandfather    Diabetes Paternal Grandmother    Cancer Paternal Grandmother    Heart disease Paternal Grandfather    Diabetes Brother    Heart disease Brother    Breast cancer Neg Hx     No Known Allergies  Current Outpatient Medications on File Prior to Visit  Medication Sig Dispense Refill   atorvastatin (LIPITOR) 40 MG tablet TAKE 1 TABLET BY MOUTH EVERY DAY FOR CHOLESTEROL 90 tablet 2   b complex vitamins capsule Take 1 capsule by mouth daily.     calcium-vitamin D (OSCAL WITH D) 500-200 MG-UNIT tablet Take 1 tablet by mouth.     cetirizine (ZYRTEC) 10 MG tablet Take 1 tablet (10 mg total) by mouth daily as needed for allergies. For allergies. 90 tablet 0   diclofenac (VOLTAREN) 75 MG EC tablet Take 1 tablet (75 mg total) by mouth at bedtime as needed for moderate pain. 90 tablet 1   fluorouracil (EFUDEX) 5 % cream Apply topically 2 (two) times daily.     gabapentin (NEURONTIN) 300 MG capsule TAKE 1 CAPSULE (300 MG TOTAL) BY MOUTH 3 (THREE) TIMES DAILY. FOR NEUROPATHY. 270 capsule 3   glipiZIDE (GLUCOTROL) 10 MG tablet Take 1 tablet (10 mg total) by mouth 2 (two) times daily before a meal. for diabetes. 180 tablet 1   lisinopril (ZESTRIL) 2.5 MG tablet TAKE 1 TABLET (2.5 MG TOTAL) BY MOUTH DAILY. FOR KIDNEY PROTECTION. 90 tablet 1   metFORMIN (GLUCOPHAGE-XR) 500 MG 24 hr tablet TAKE 2 TABLETS (1,000 MG TOTAL) BY MOUTH 2 (TWO) TIMES DAILY WITH A MEAL. FOR DIABETES. 360 tablet 1   Multiple Vitamin (MULTIVITAMIN) capsule Take 1 capsule by mouth daily.     traZODone (DESYREL) 50 MG tablet TAKE 1 TABLET (50 MG TOTAL) BY MOUTH AT BEDTIME. FOR SLEEP 90 tablet 0   Biotin 54098 MCG TBDP Take by mouth. (Patient not taking: Reported on 06/12/2023)     Semaglutide,0.25 or 0.5MG /DOS, (OZEMPIC, 0.25 OR 0.5 MG/DOSE,) 2 MG/3ML SOPN Start by injecting 0.25 mg into the skin once weekly for 4 weeks, then increase to 0.5 mg  once weekly thereafter for diabetes. (Patient not taking: Reported on 06/12/2023) 9 mL 0   triamcinolone cream (KENALOG) 0.1 % Apply topically 2 (two) times daily. (Patient not taking: Reported on 06/12/2023)     No current facility-administered medications on file prior to visit.    BP 112/64   Pulse 70   Temp (!) 97.1 F (36.2 C) (Temporal)   Ht 5' 6.5" (1.689 m)   Wt 198 lb (89.8 kg)   SpO2 96%   BMI 31.48 kg/m  Objective:   Physical Exam  Cardiovascular:     Rate and Rhythm: Normal rate and regular rhythm.  Pulmonary:     Effort: Pulmonary effort is normal.     Breath sounds: Normal breath sounds.  Musculoskeletal:     Cervical back: Neck supple.  Skin:    General: Skin is warm and dry.           Assessment & Plan:  Type 2 diabetes mellitus with diabetic mononeuropathy, without long-term current use of insulin (HCC) Assessment & Plan: Improved and controlled with A1C of 6.5 today.  Continue metformin XR 1000 mg BID, glipizide 10 mg BID.   Continue gabapentin 300 mg. She will alter the time she is taking.  She will schedule eye exam.  Follow up in 6 months.  Orders: -     POCT glycosylated hemoglobin (Hb A1C)  Paresthesias in right hand Assessment & Plan: Symptoms suggestive of carpal tunnel.  She will start by wearing a carpal tunnel brace at night. Consider referral to orthopedics.  She will update.    Decreased hearing of both ears Assessment & Plan: Ear exam today without cerumen impaction. See results of hearing test in the office.         Doreene Nest, NP

## 2023-06-22 ENCOUNTER — Other Ambulatory Visit: Payer: Self-pay | Admitting: Primary Care

## 2023-06-22 DIAGNOSIS — E7800 Pure hypercholesterolemia, unspecified: Secondary | ICD-10-CM

## 2023-07-04 ENCOUNTER — Ambulatory Visit (INDEPENDENT_AMBULATORY_CARE_PROVIDER_SITE_OTHER): Payer: Medicare Other

## 2023-07-04 ENCOUNTER — Other Ambulatory Visit: Payer: Self-pay

## 2023-07-04 VITALS — Ht 66.5 in | Wt 197.0 lb

## 2023-07-04 DIAGNOSIS — Z0000 Encounter for general adult medical examination without abnormal findings: Secondary | ICD-10-CM | POA: Diagnosis not present

## 2023-07-04 DIAGNOSIS — Z1211 Encounter for screening for malignant neoplasm of colon: Secondary | ICD-10-CM | POA: Diagnosis not present

## 2023-07-04 DIAGNOSIS — I8393 Asymptomatic varicose veins of bilateral lower extremities: Secondary | ICD-10-CM

## 2023-07-04 NOTE — Patient Instructions (Signed)
Ms. Egusquiza , Thank you for taking time to come for your Medicare Wellness Visit. I appreciate your ongoing commitment to your health goals. Please review the following plan we discussed and let me know if I can assist you in the future.   Referrals/Orders/Follow-Ups/Clinician Recommendations: Aim for 30 minutes of exercise or brisk walking, 6-8 glasses of water, and 5 servings of fruits and vegetables each day.   This is a list of the screening recommended for you and due dates:  Health Maintenance  Topic Date Due   Zoster (Shingles) Vaccine (1 of 2) 09/06/2004   COVID-19 Vaccine (4 - 2023-24 season) 07/13/2022   Cologuard (Stool DNA test)  12/20/2022   Medicare Annual Wellness Visit  06/07/2023   Flu Shot  06/13/2023   Hepatitis C Screening  11/28/2023*   Eye exam for diabetics  06/11/2024*   Yearly kidney function blood test for diabetes  11/28/2023   Yearly kidney health urinalysis for diabetes  11/28/2023   Complete foot exam   11/28/2023   Hemoglobin A1C  12/13/2023   Mammogram  01/09/2025   DTaP/Tdap/Td vaccine (2 - Td or Tdap) 04/26/2025   Pneumonia Vaccine  Completed   DEXA scan (bone density measurement)  Completed   HPV Vaccine  Aged Out  *Topic was postponed. The date shown is not the original due date.    Advanced directives: (Declined) Advance directive discussed with you today. Even though you declined this today, please call our office should you change your mind, and we can give you the proper paperwork for you to fill out.  Next Medicare Annual Wellness Visit scheduled for next year: Yes

## 2023-07-04 NOTE — Progress Notes (Signed)
Subjective:   Amber English is a 67 y.o. female who presents for Medicare Annual (Subsequent) preventive examination.  Visit Complete: Virtual  I connected with  Amber English on 07/04/23 by a audio enabled telemedicine application and verified that I am speaking with the correct person using two identifiers.  Patient Location: Home  Provider Location: Office/Clinic  I discussed the limitations of evaluation and management by telemedicine. The patient expressed understanding and agreed to proceed.  Patient Medicare AWV questionnaire was completed by the patient on 07/03/23; I have confirmed that all information answered by patient is correct and no changes since this date.  Vital Signs: Because this visit was a virtual/telehealth visit, some criteria may be missing or patient reported. Any vitals not documented were not able to be obtained and vitals that have been documented are patient reported.    Review of Systems      Cardiac Risk Factors include: advanced age (>28men, >47 women);dyslipidemia;diabetes mellitus;sedentary lifestyle;obesity (BMI >30kg/m2)     Objective:    Today's Vitals   07/04/23 0842  Weight: 197 lb (89.4 kg)  Height: 5' 6.5" (1.689 m)   Body mass index is 31.32 kg/m.     07/04/2023    8:52 AM 06/06/2022   11:04 AM  Advanced Directives  Does Patient Have a Medical Advance Directive? No No  Would patient like information on creating a medical advance directive? No - Patient declined     Current Medications (verified) Outpatient Encounter Medications as of 07/04/2023  Medication Sig   atorvastatin (LIPITOR) 40 MG tablet TAKE 1 TABLET BY MOUTH EVERY DAY FOR CHOLESTEROL   b complex vitamins capsule Take 1 capsule by mouth daily.   calcium-vitamin D (OSCAL WITH D) 500-200 MG-UNIT tablet Take 1 tablet by mouth.   cetirizine (ZYRTEC) 10 MG tablet Take 1 tablet (10 mg total) by mouth daily as needed for allergies. For allergies.   diclofenac  (VOLTAREN) 75 MG EC tablet Take 1 tablet (75 mg total) by mouth at bedtime as needed for moderate pain.   gabapentin (NEURONTIN) 300 MG capsule TAKE 1 CAPSULE (300 MG TOTAL) BY MOUTH 3 (THREE) TIMES DAILY. FOR NEUROPATHY.   glipiZIDE (GLUCOTROL) 10 MG tablet Take 1 tablet (10 mg total) by mouth 2 (two) times daily before a meal. for diabetes.   lisinopril (ZESTRIL) 2.5 MG tablet TAKE 1 TABLET (2.5 MG TOTAL) BY MOUTH DAILY. FOR KIDNEY PROTECTION.   metFORMIN (GLUCOPHAGE-XR) 500 MG 24 hr tablet TAKE 2 TABLETS (1,000 MG TOTAL) BY MOUTH 2 (TWO) TIMES DAILY WITH A MEAL. FOR DIABETES.   Multiple Vitamin (MULTIVITAMIN) capsule Take 1 capsule by mouth daily.   traZODone (DESYREL) 50 MG tablet TAKE 1 TABLET (50 MG TOTAL) BY MOUTH AT BEDTIME. FOR SLEEP   fluorouracil (EFUDEX) 5 % cream Apply topically 2 (two) times daily.   triamcinolone cream (KENALOG) 0.1 % Apply topically 2 (two) times daily. (Patient not taking: Reported on 06/12/2023)   No facility-administered encounter medications on file as of 07/04/2023.    Allergies (verified) Patient has no known allergies.   History: Past Medical History:  Diagnosis Date   Diabetes mellitus without complication (HCC)    Hyperlipidemia    Melanoma in situ of scalp (HCC)    Past Surgical History:  Procedure Laterality Date   FOOT FRACTURE SURGERY     TOTAL ABDOMINAL HYSTERECTOMY  2022   Family History  Problem Relation Age of Onset   Esophageal cancer Mother    Diabetes Father  Heart disease Father    Diabetes Sister    Lung cancer Sister    Heart disease Maternal Grandfather    Diabetes Paternal Grandmother    Cancer Paternal Grandmother    Heart disease Paternal Grandfather    Diabetes Brother    Heart disease Brother    Breast cancer Neg Hx    Social History   Socioeconomic History   Marital status: Divorced    Spouse name: Not on file   Number of children: Not on file   Years of education: Not on file   Highest education level:  Not on file  Occupational History   Not on file  Tobacco Use   Smoking status: Never   Smokeless tobacco: Never  Vaping Use   Vaping status: Never Used  Substance and Sexual Activity   Alcohol use: Yes    Comment: occasional   Drug use: Not Currently   Sexual activity: Not Currently    Birth control/protection: None  Other Topics Concern   Not on file  Social History Narrative   Live alone with dog   Social Determinants of Health   Financial Resource Strain: Low Risk  (07/03/2023)   Overall Financial Resource Strain (CARDIA)    Difficulty of Paying Living Expenses: Not hard at all  Food Insecurity: No Food Insecurity (07/03/2023)   Hunger Vital Sign    Worried About Running Out of Food in the Last Year: Never true    Ran Out of Food in the Last Year: Never true  Transportation Needs: No Transportation Needs (07/03/2023)   PRAPARE - Administrator, Civil Service (Medical): No    Lack of Transportation (Non-Medical): No  Physical Activity: Insufficiently Active (07/03/2023)   Exercise Vital Sign    Days of Exercise per Week: 2 days    Minutes of Exercise per Session: 30 min  Stress: No Stress Concern Present (07/03/2023)   [employer] of Occupational Health - Occupational Stress Questionnaire    Feeling of Stress : Only a little  Social Connections: Moderately Isolated (07/03/2023)   Social Connection and Isolation Panel [NHANES]    Frequency of Communication with Friends and Family: More than three times a week    Frequency of Social Gatherings with Friends and Family: Twice a week    Attends Religious Services: Never    Database administrator or Organizations: Yes    Attends Engineer, structural: More than 4 times per year    Marital Status: Divorced    Tobacco Counseling Counseling given: Not Answered   Clinical Intake:  Pre-visit preparation completed: Yes  Pain : No/denies pain     BMI - recorded: 31.32 Nutritional Status: BMI > 30   Obese Nutritional Risks: None Diabetes: Yes CBG done?: Yes (106 per pt) CBG resulted in Enter/ Edit results?: No Did pt. bring in CBG monitor from home?: No  How often do you need to have someone help you when you read instructions, pamphlets, or other written materials from your doctor or pharmacy?: 1 - Never  Interpreter Needed?: No  Information entered by :: C.Judson Tsan LPN   Activities of Daily Living    07/03/2023   11:06 AM  In your present state of health, do you have any difficulty performing the following activities:  Hearing? 0  Vision? 0  Difficulty concentrating or making decisions? 0  Walking or climbing stairs? 0  Dressing or bathing? 0  Doing errands, shopping? 0  Preparing Food and eating ? N  Using the Toilet? N  In the past six months, have you accidently leaked urine? Y  Comment Occasionally  Do you have problems with loss of bowel control? Y  Comment occasionally  Managing your Medications? N  Managing your Finances? N  Housekeeping or managing your Housekeeping? N    Patient Care Team: Doreene Nest, NP as PCP - General (Internal Medicine) [facility]  Indicate any recent Medical Services you may have received from other than [facility] providers in the past year (date may be approximate).     Assessment:   This is a routine wellness examination for Amber English.  Hearing/Vision screen Hearing Screening - Comments:: Slight hearing loss no aids  Vision Screening - Comments:: Glasses - [facility] -Pt stated she will call for exam  Dietary issues and exercise activities discussed:     Goals Addressed             This Visit's Progress    Patient Stated       Increase exercise and lose weight.       Depression Screen    07/04/2023    8:46 AM 11/27/2022    7:58 AM 06/06/2022   11:05 AM 05/23/2022    9:20 AM 03/28/2021   10:37 AM 10/25/2020   11:41 AM  PHQ 2/9 Scores  PHQ - 2 Score 0 0 0 2 0 0  PHQ- 9 Score    3 0     Fall Risk     07/03/2023   11:06 AM 06/12/2023    8:02 AM 11/27/2022    7:57 AM 06/06/2022   11:05 AM 05/23/2022    9:25 AM  Fall Risk   Falls in the past year? 0 0 0 0 0  Number falls in past yr: 0 0 0 0 0  Injury with Fall? 0 0 0 0 0  Risk for fall due to : No Fall Risks No Fall Risks No Fall Risks Medication side effect   Follow up Falls prevention discussed;Falls evaluation completed Falls evaluation completed Falls evaluation completed Falls evaluation completed;Education provided;Falls prevention discussed     MEDICARE RISK AT HOME: Medicare Risk at Home Any stairs in or around the home?: Yes If so, are there any without handrails?: No Home free of loose throw rugs in walkways, pet beds, electrical cords, etc?: No Adequate lighting in your home to reduce risk of falls?: Yes Life alert?: No Use of a cane, walker or w/c?: No Grab bars in the bathroom?: Yes Shower chair or bench in shower?: No Elevated toilet seat or a handicapped toilet?: No  TIMED UP AND GO:  Was the test performed?  No    Cognitive Function:        07/04/2023    8:55 AM 06/06/2022   11:09 AM  6CIT Screen  What Year? 0 points 0 points  What month? 0 points 0 points  What time? 0 points 0 points  Count back from 20 0 points 0 points  Months in reverse 0 points 0 points  Repeat phrase 0 points 0 points  Total Score 0 points 0 points    Immunizations Immunization History  Administered Date(s) Administered   Influenza Split 11/18/2012   Influenza, High Dose Seasonal PF 08/17/2021   Influenza, Seasonal, Injecte, Preservative Fre 10/07/2013, 11/24/2014   Influenza,inj,quad, With Preservative 09/14/2015, 08/29/2016   Influenza-Unspecified 08/17/2021   PFIZER(Purple Top)SARS-COV-2 Vaccination 02/03/2020, 02/24/2020   PNEUMOCOCCAL CONJUGATE-20 06/12/2023   Pfizer Covid-19 Vaccine Bivalent Booster 32yrs & up 08/17/2021  Pneumococcal Conjugate-13 10/07/2013   Pneumococcal Polysaccharide-23 11/24/2014   Tdap  04/27/2015   Zoster, Live 11/24/2014    TDAP status: Up to date  Flu Vaccine status: Due, Education has been provided regarding the importance of this vaccine. Advised may receive this vaccine at local pharmacy or Health Dept. Aware to provide a copy of the vaccination record if obtained from local pharmacy or Health Dept. Verbalized acceptance and understanding.  Pneumococcal vaccine status: Up to date  Covid-19 vaccine status: Information provided on how to obtain vaccines.   Qualifies for Shingles Vaccine? Yes   Zostavax completed Yes   Shingrix Completed?: No.    Education has been provided regarding the importance of this vaccine. Patient has been advised to call insurance company to determine out of pocket expense if they have not yet received this vaccine. Advised may also receive vaccine at local pharmacy or Health Dept. Verbalized acceptance and understanding.  Screening Tests Health Maintenance  Topic Date Due   Zoster Vaccines- Shingrix (1 of 2) 09/06/2004   COVID-19 Vaccine (4 - 2023-24 season) 07/13/2022   Fecal DNA (Cologuard)  12/20/2022   INFLUENZA VACCINE  06/13/2023   Hepatitis C Screening  11/28/2023 (Originally 09/06/1974)   OPHTHALMOLOGY EXAM  06/11/2024 (Originally 04/19/2023)   Diabetic kidney evaluation - eGFR measurement  11/28/2023   Diabetic kidney evaluation - Urine ACR  11/28/2023   FOOT EXAM  11/28/2023   HEMOGLOBIN A1C  12/13/2023   Medicare Annual Wellness (AWV)  07/03/2024   MAMMOGRAM  01/09/2025   DTaP/Tdap/Td (2 - Td or Tdap) 04/26/2025   Pneumonia Vaccine 66+ Years old  Completed   DEXA SCAN  Completed   HPV VACCINES  Aged Out    Health Maintenance  Health Maintenance Due  Topic Date Due   Zoster Vaccines- Shingrix (1 of 2) 09/06/2004   COVID-19 Vaccine (4 - 2023-24 season) 07/13/2022   Fecal DNA (Cologuard)  12/20/2022   INFLUENZA VACCINE  06/13/2023    Colorectal cancer screening: Type of screening: Cologuard. Completed 12/21/19.  Repeat every 3 years order placed 07/04/23  Mammogram status: Completed 01/09/23. Repeat every year  Bone Density status: Completed 01/09/23. Results reflect: Bone density results: NORMAL. Repeat every 5 years.  Lung Cancer Screening: (Low Dose CT Chest recommended if Age 10-80 years, 20 pack-year currently smoking OR have quit w/in 15years.) does not qualify.   Lung Cancer Screening Referral:    Additional Screening:  Hepatitis C Screening: does qualify; Completed DUE  Vision Screening: Recommended annual ophthalmology exams for early detection of glaucoma and other disorders of the eye. Is the patient up to date with their annual eye exam?  No pt stated she will call for appointment Who is the provider or what is the name of the office in which the patient attends annual eye exams? [facility] If pt is not established with a provider, would they like to be referred to a provider to establish care? Yes .   Dental Screening: Recommended annual dental exams for proper oral hygiene  Diabetic Foot Exam: Diabetic Foot Exam: Completed 11/27/22  Community Resource Referral / Chronic Care Management: CRR required this visit?  No   CCM required this visit?  No     Plan:     I have personally reviewed and noted the following in the patient's chart:   Medical and social history Use of alcohol, tobacco or illicit drugs  Current medications and supplements including opioid prescriptions. Patient is not currently taking opioid prescriptions. Functional ability and status  Nutritional status Physical activity Advanced directives List of other physicians Hospitalizations, surgeries, and ER visits in previous 12 months Vitals Screenings to include cognitive, depression, and falls Referrals and appointments  In addition, I have reviewed and discussed with patient certain preventive protocols, quality metrics, and best practice recommendations. A written personalized care plan for  preventive services as well as general preventive health recommendations were provided to patient.     Maryan Puls, LPN   1/61/0960   After Visit Summary: (MyChart) Due to this being a telephonic visit, the after visit summary with patients personalized plan was offered to patient via MyChart   Nurse Notes: none

## 2023-07-11 ENCOUNTER — Other Ambulatory Visit: Payer: Self-pay | Admitting: Primary Care

## 2023-07-11 DIAGNOSIS — E1141 Type 2 diabetes mellitus with diabetic mononeuropathy: Secondary | ICD-10-CM

## 2023-07-17 ENCOUNTER — Encounter: Payer: Self-pay | Admitting: Vascular Surgery

## 2023-07-17 ENCOUNTER — Ambulatory Visit (INDEPENDENT_AMBULATORY_CARE_PROVIDER_SITE_OTHER): Payer: Medicare Other | Admitting: Vascular Surgery

## 2023-07-17 ENCOUNTER — Ambulatory Visit (HOSPITAL_COMMUNITY)
Admission: RE | Admit: 2023-07-17 | Discharge: 2023-07-17 | Disposition: A | Discharge status: Home / Self Care | Payer: Medicare Other | Source: Ambulatory Visit | Attending: Vascular Surgery | Admitting: Vascular Surgery

## 2023-07-17 VITALS — BP 108/70 | HR 72 | Temp 98.2°F | Resp 18 | Ht 67.0 in | Wt 197.0 lb

## 2023-07-17 DIAGNOSIS — I872 Venous insufficiency (chronic) (peripheral): Secondary | ICD-10-CM

## 2023-07-17 DIAGNOSIS — I8393 Asymptomatic varicose veins of bilateral lower extremities: Secondary | ICD-10-CM | POA: Insufficient documentation

## 2023-07-17 NOTE — Progress Notes (Signed)
ASSESSMENT & PLAN   CHRONIC VENOUS INSUFFICIENCY: This patient does have symptoms of venous hypertension related to her chronic venous insufficiency.  She has both deep venous reflux and superficial venous reflux.  Currently I do not think her superficial venous reflux is contributing significantly to her symptoms.  We have discussed conservative measures.  I have encouraged her to avoid prolonged sitting and standing.  We have discussed the importance of exercise specifically walking and water aerobics.  She will continue to wear her compressive stockings if she will be sitting or standing for a long period of time.  We have discussed the importance of daily leg elevation and the proper positioning for this.  In addition we have discussed the importance of maintaining a healthy weight.  Currently as the vein is not especially dilated and there is only a short segment of reflux in the proximal thigh I do not think laser ablation of the saphenous vein would significantly impact her symptoms at this point.  This certainly could change over time and we can reevaluate her if her symptoms progress.  REASON FOR CONSULT:    Bilateral varicose veins.  The consult is requested by Vernona Rieger, PA.  HPI:   Amber English is a 67 y.o. female who presents for evaluation of chronic venous insufficiency.  She has telangiectasias of both lower extremities and symptoms from venous hypertension.  On my history, she describes aching pain and heaviness in both legs which is aggravated by sitting and standing and relieved somewhat with elevation.  Her symptoms are worse at the end of the day.  She has had no previous history of DVT.  She has had no previous venous procedures.  She does wear compression stockings sometimes when she travels.  She elevates her legs some and this does help her symptoms.  Past Medical History:  Diagnosis Date   Diabetes mellitus without complication (HCC)    Hyperlipidemia     Melanoma in situ of scalp (HCC)     Family History  Problem Relation Age of Onset   Esophageal cancer Mother    Diabetes Father    Heart disease Father    Diabetes Sister    Lung cancer Sister    Heart disease Maternal Grandfather    Diabetes Paternal Grandmother    Cancer Paternal Grandmother    Heart disease Paternal Grandfather    Diabetes Brother    Heart disease Brother    Breast cancer Neg Hx     SOCIAL HISTORY: Social History   Tobacco Use   Smoking status: Never   Smokeless tobacco: Never  Substance Use Topics   Alcohol use: Yes    Comment: occasional    No Known Allergies  Current Outpatient Medications  Medication Sig Dispense Refill   atorvastatin (LIPITOR) 40 MG tablet TAKE 1 TABLET BY MOUTH EVERY DAY FOR CHOLESTEROL 90 tablet 1   b complex vitamins capsule Take 1 capsule by mouth daily.     calcium-vitamin D (OSCAL WITH D) 500-200 MG-UNIT tablet Take 1 tablet by mouth.     cetirizine (ZYRTEC) 10 MG tablet Take 1 tablet (10 mg total) by mouth daily as needed for allergies. For allergies. 90 tablet 0   diclofenac (VOLTAREN) 75 MG EC tablet Take 1 tablet (75 mg total) by mouth at bedtime as needed for moderate pain. 90 tablet 1   gabapentin (NEURONTIN) 300 MG capsule TAKE 1 CAPSULE (300 MG TOTAL) BY MOUTH 3 (THREE) TIMES DAILY. FOR NEUROPATHY. 270 capsule  3   glipiZIDE (GLUCOTROL) 10 MG tablet TAKE 1 TABLET (10 MG TOTAL) BY MOUTH 2 (TWO) TIMES DAILY BEFORE A MEAL. FOR DIABETES. 180 tablet 1   lisinopril (ZESTRIL) 2.5 MG tablet TAKE 1 TABLET (2.5 MG TOTAL) BY MOUTH DAILY. FOR KIDNEY PROTECTION. 90 tablet 1   metFORMIN (GLUCOPHAGE-XR) 500 MG 24 hr tablet TAKE 2 TABLETS (1,000 MG TOTAL) BY MOUTH 2 (TWO) TIMES DAILY WITH A MEAL. FOR DIABETES. 360 tablet 1   Multiple Vitamin (MULTIVITAMIN) capsule Take 1 capsule by mouth daily.     traZODone (DESYREL) 50 MG tablet TAKE 1 TABLET (50 MG TOTAL) BY MOUTH AT BEDTIME. FOR SLEEP 90 tablet 0   fluorouracil (EFUDEX) 5 % cream  Apply topically 2 (two) times daily.     triamcinolone cream (KENALOG) 0.1 % Apply topically 2 (two) times daily. (Patient not taking: Reported on 06/12/2023)     No current facility-administered medications for this visit.    REVIEW OF SYSTEMS:  [X]  denotes positive finding, [ ]  denotes negative finding Cardiac  Comments:  Chest pain or chest pressure:    Shortness of breath upon exertion:    Short of breath when lying flat:    Irregular heart rhythm:        Vascular    Pain in calf, thigh, or hip brought on by ambulation:    Pain in feet at night that wakes you up from your sleep:     Blood clot in your veins:    Leg swelling:  x       Pulmonary    Oxygen at home:    Productive cough:     Wheezing:         Neurologic    Sudden weakness in arms or legs:     Sudden numbness in arms or legs:     Sudden onset of difficulty speaking or slurred speech:    Temporary loss of vision in one eye:     Problems with dizziness:         Gastrointestinal    Blood in stool:     Vomited blood:         Genitourinary    Burning when urinating:     Blood in urine:        Psychiatric    Major depression:         Hematologic    Bleeding problems:    Problems with blood clotting too easily:        Skin    Rashes or ulcers:        Constitutional    Fever or chills:    -  PHYSICAL EXAM:   Vitals:   07/17/23 1405  BP: 108/70  Pulse: 72  Resp: 18  Temp: 98.2 F (36.8 C)  TempSrc: Temporal  SpO2: 98%  Weight: 197 lb (89.4 kg)  Height: 5\' 7"  (1.702 m)   Body mass index is 30.85 kg/m. GENERAL: The patient is a well-nourished female, in no acute distress. The vital signs are documented above. CARDIAC: There is a regular rate and rhythm.  VASCULAR: I do not detect carotid bruits. She has palpable pedal pulses. She has some telangiectasias bilaterally and has corona phlebectatica of both ankles which is more significant on the left side.       I did look at her left  great saphenous vein myself with the SonoSite.  She has reflux in the proximal thigh and down by the knee but the vein is not dilated  in any of these areas on my exam today.  PULMONARY: There is good air exchange bilaterally without wheezing or rales. ABDOMEN: Soft and non-tender with normal pitched bowel sounds.  MUSCULOSKELETAL: There are no major deformities. NEUROLOGIC: No focal weakness or paresthesias are detected. SKIN: There are no ulcers or rashes noted. PSYCHIATRIC: The patient has a normal affect.  DATA:    VENOUS DUPLEX: I have independently interpreted her venous duplex scan today.  This was in the left lower extremity only.  There was no evidence of DVT.  There was deep venous reflux involving the common femoral vein and femoral vein.  There was superficial venous reflux in the left great saphenous vein.  The vein was dilated up to 5 mm in the proximal thigh.  The results of the study are summarized on the diagram below.    Waverly [facility]

## 2023-07-23 LAB — COLOGUARD: COLOGUARD: NEGATIVE

## 2023-07-31 ENCOUNTER — Other Ambulatory Visit: Payer: Self-pay | Admitting: Primary Care

## 2023-07-31 DIAGNOSIS — E1141 Type 2 diabetes mellitus with diabetic mononeuropathy: Secondary | ICD-10-CM

## 2023-08-06 ENCOUNTER — Other Ambulatory Visit: Payer: Self-pay | Admitting: Primary Care

## 2023-08-06 DIAGNOSIS — E1141 Type 2 diabetes mellitus with diabetic mononeuropathy: Secondary | ICD-10-CM

## 2023-09-07 ENCOUNTER — Other Ambulatory Visit: Payer: Self-pay | Admitting: Primary Care

## 2023-09-07 DIAGNOSIS — R6889 Other general symptoms and signs: Secondary | ICD-10-CM

## 2023-11-12 ENCOUNTER — Other Ambulatory Visit: Payer: Self-pay | Admitting: Primary Care

## 2023-11-12 DIAGNOSIS — E1141 Type 2 diabetes mellitus with diabetic mononeuropathy: Secondary | ICD-10-CM

## 2023-12-12 ENCOUNTER — Ambulatory Visit (INDEPENDENT_AMBULATORY_CARE_PROVIDER_SITE_OTHER): Payer: Medicare Other | Admitting: Primary Care

## 2023-12-12 ENCOUNTER — Encounter: Payer: Self-pay | Admitting: Primary Care

## 2023-12-12 ENCOUNTER — Other Ambulatory Visit (HOSPITAL_BASED_OUTPATIENT_CLINIC_OR_DEPARTMENT_OTHER): Payer: Self-pay

## 2023-12-12 VITALS — BP 136/82 | HR 75 | Temp 97.7°F | Ht 67.0 in | Wt 198.0 lb

## 2023-12-12 DIAGNOSIS — E7800 Pure hypercholesterolemia, unspecified: Secondary | ICD-10-CM | POA: Diagnosis not present

## 2023-12-12 DIAGNOSIS — Z1231 Encounter for screening mammogram for malignant neoplasm of breast: Secondary | ICD-10-CM

## 2023-12-12 DIAGNOSIS — R202 Paresthesia of skin: Secondary | ICD-10-CM

## 2023-12-12 DIAGNOSIS — M25551 Pain in right hip: Secondary | ICD-10-CM | POA: Diagnosis not present

## 2023-12-12 DIAGNOSIS — M25532 Pain in left wrist: Secondary | ICD-10-CM

## 2023-12-12 DIAGNOSIS — H6120 Impacted cerumen, unspecified ear: Secondary | ICD-10-CM | POA: Insufficient documentation

## 2023-12-12 DIAGNOSIS — Z23 Encounter for immunization: Secondary | ICD-10-CM | POA: Diagnosis not present

## 2023-12-12 DIAGNOSIS — Z7984 Long term (current) use of oral hypoglycemic drugs: Secondary | ICD-10-CM

## 2023-12-12 DIAGNOSIS — G8929 Other chronic pain: Secondary | ICD-10-CM

## 2023-12-12 DIAGNOSIS — H6123 Impacted cerumen, bilateral: Secondary | ICD-10-CM

## 2023-12-12 DIAGNOSIS — I8393 Asymptomatic varicose veins of bilateral lower extremities: Secondary | ICD-10-CM | POA: Diagnosis not present

## 2023-12-12 DIAGNOSIS — E1141 Type 2 diabetes mellitus with diabetic mononeuropathy: Secondary | ICD-10-CM | POA: Diagnosis not present

## 2023-12-12 DIAGNOSIS — R6889 Other general symptoms and signs: Secondary | ICD-10-CM

## 2023-12-12 LAB — COMPREHENSIVE METABOLIC PANEL
ALT: 22 U/L (ref 0–35)
AST: 18 U/L (ref 0–37)
Albumin: 4.2 g/dL (ref 3.5–5.2)
Alkaline Phosphatase: 65 U/L (ref 39–117)
BUN: 18 mg/dL (ref 6–23)
CO2: 28 meq/L (ref 19–32)
Calcium: 9.3 mg/dL (ref 8.4–10.5)
Chloride: 105 meq/L (ref 96–112)
Creatinine, Ser: 0.73 mg/dL (ref 0.40–1.20)
GFR: 84 mL/min (ref >60.00)
Glucose, Bld: 109 mg/dL — ABNORMAL HIGH (ref 70–99)
Potassium: 4.6 meq/L (ref 3.5–5.1)
Sodium: 141 meq/L (ref 135–145)
Total Bilirubin: 0.4 mg/dL (ref 0.2–1.2)
Total Protein: 6.3 g/dL (ref 6.0–8.3)

## 2023-12-12 LAB — LIPID PANEL
Cholesterol: 166 mg/dL (ref 0–200)
HDL: 58.1 mg/dL (ref >39.00)
LDL Cholesterol: 79 mg/dL (ref 0–99)
NonHDL: 107.99
Total CHOL/HDL Ratio: 3
Triglycerides: 143 mg/dL (ref 0.0–149.0)
VLDL: 28.6 mg/dL (ref 0.0–40.0)

## 2023-12-12 LAB — MICROALBUMIN / CREATININE URINE RATIO
Creatinine,U: 67.1 mg/dL
Microalb Creat Ratio: 1.1 mg/g (ref 0.0–30.0)
Microalb, Ur: 0.8 mg/dL (ref 0.0–1.9)

## 2023-12-12 LAB — CBC
HCT: 38.1 % (ref 36.0–46.0)
Hemoglobin: 12.7 g/dL (ref 12.0–15.0)
MCHC: 33.3 g/dL (ref 30.0–36.0)
MCV: 96.8 fL (ref 78.0–100.0)
Platelets: 241 10*3/uL (ref 150.0–400.0)
RBC: 3.94 Mil/uL (ref 3.87–5.11)
RDW: 13.4 % (ref 11.5–15.5)
WBC: 8.3 10*3/uL (ref 4.0–10.5)

## 2023-12-12 LAB — POCT GLYCOSYLATED HEMOGLOBIN (HGB A1C): Hemoglobin A1C: 6.8 % — AB (ref 4.0–5.6)

## 2023-12-12 MED ORDER — DICLOFENAC SODIUM 75 MG PO TBEC
75.0000 mg | DELAYED_RELEASE_TABLET | Freq: Every evening | ORAL | 0 refills | Status: DC | PRN
  Filled 2023-12-12: qty 90, 90d supply, fill #0

## 2023-12-12 NOTE — Assessment & Plan Note (Signed)
Likely tendonitis. No evidence of fracture.  Resume diclofenac 75 mg daily as needed. Discussed bracing for support. Discussed sports medicine evaluation if no improvement.

## 2023-12-12 NOTE — Assessment & Plan Note (Addendum)
Slightly deteriorated with A1C of 6.8.  Recommended GLP 1 agonist treatment for weight management and diabetes. She kindly declines.  Continue glipizide 10 mg BID and metformin ER 1000 mg BID. Continue lisinopril 2.5 mg for renal protection.   Urine microalbumin due and pending. Foot exam today.   Follow up in 6 months.

## 2023-12-12 NOTE — Progress Notes (Signed)
Subjective:    Patient ID: Amber English, female    DOB: 1956-07-22, 68 y.o.   MRN: [id number]  HPI  Amber English is a very pleasant 68 y.o. female with a history of type 2 diabetes, hyperlipidemia, anxiety depression who presents today for follow-up of chronic conditions.  Immunizations: -Tetanus: Completed in 2016 -Influenza: Influenza vaccine provided today. -Shingles: Completed Zostavax -Pneumonia: Completed Prevnar 20 in 2024  Mammogram: Completed in March 2024 Bone Density Scan: Completed in February 2024  Colonoscopy: Completed Cologuard in September 2024, negative.   1) Type 2 Diabetes:  Current medications include: Metformin ER 1000 mg twice daily, glipizide 10 mg twice daily  She is checking her blood glucose 1 times daily and is getting readings of:  AM fasting: 99-low 100s, mid 100s for a few weeks.   Last A1C: 6.5 in July 2024 Last Eye Exam: Up-to-date Last Foot Exam: Due Pneumonia Vaccination: 2024 Urine Microalbumin: Due Statin: Atorvastatin  Dietary changes since last visit: Overall healthy diet, limiting sweets, cut back on snacking. Tries to make healthy choices. Increased protein.   Exercise: None  2) Chronic Hip Pain: Currently managed on gabapentin 300 mg 1-3 times daily, diclofenac 75 mg BID PRN. She uses diclofenac sparingly, last bottle has expired. She feels well managed on her current regimen and is not bothered by hip pain.   3) Anxiety and Depression: Currently following with therapy. Also managed on Trazodone 50 mg HS which was initiated for vivid dreams. She continues to experience vivid dreams. She denies changes in her dreams which are not bothersome.   4) Ear Fullness: Acutely noted to the right ear. History of cerumen impaction. Right ear feels clogged. Has noticed decreased ability to hear out of the right ear.  She tried applying some Debrox drops recently.  5) Acute Wrist Pain: Since December 2024 after a near syncopal  episode that occurred while she had a Covid-19 infection in early December 2024. No injury, but she lowered herself to the ground, was unable to get up by herself, had to drag her body across the house with her upper extremities.  This is when her pain began.  Her pain is intermittent, resolved when waking in the morning, worse throughout the day.  She also babysits her grandson and holds him in her left arms.  She denies decrease in range of motion, erythema, swelling.  BP Readings from Last 3 Encounters:  12/12/23 136/82  07/17/23 108/70  06/12/23 112/64   Wt Readings from Last 3 Encounters:  12/12/23 198 lb (89.8 kg)  07/17/23 197 lb (89.4 kg)  07/04/23 197 lb (89.4 kg)     Review of Systems  Eyes:  Negative for visual disturbance.  Respiratory:  Negative for shortness of breath.   Cardiovascular:  Negative for chest pain.  Musculoskeletal:  Negative for arthralgias.  Neurological:  Positive for numbness.  Psychiatric/Behavioral:  Negative for sleep disturbance. The patient is not nervous/anxious.          Past Medical History:  Diagnosis Date   Diabetes mellitus without complication (HCC)    Hyperlipidemia    Melanoma in situ of scalp (HCC)     Social History   Socioeconomic History   Marital status: Divorced    Spouse name: Not on file   Number of children: Not on file   Years of education: Not on file   Highest education level: 12th grade  Occupational History   Not on file  Tobacco Use   Smoking  status: Never   Smokeless tobacco: Never  Vaping Use   Vaping status: Never Used  Substance and Sexual Activity   Alcohol use: Yes    Comment: occasional   Drug use: Not Currently   Sexual activity: Not Currently    Birth control/protection: None  Other Topics Concern   Not on file  Social History Narrative   Live alone with dog   Social Drivers of Health   Financial Resource Strain: Low Risk  (12/11/2023)   Overall Financial Resource Strain (CARDIA)     Difficulty of Paying Living Expenses: Not hard at all  Food Insecurity: No Food Insecurity (12/11/2023)   Hunger Vital Sign    Worried About Running Out of Food in the Last Year: Never true    Ran Out of Food in the Last Year: Never true  Transportation Needs: No Transportation Needs (12/11/2023)   PRAPARE - Administrator, Civil Service (Medical): No    Lack of Transportation (Non-Medical): No  Physical Activity: Insufficiently Active (07/03/2023)   Exercise Vital Sign    Days of Exercise per Week: 2 days    Minutes of Exercise per Session: 30 min  Stress: No Stress Concern Present (12/11/2023)   [employer] of Occupational Health - Occupational Stress Questionnaire    Feeling of Stress : Only a little  Social Connections: Moderately Isolated (12/11/2023)   Social Connection and Isolation Panel [NHANES]    Frequency of Communication with Friends and Family: More than three times a week    Frequency of Social Gatherings with Friends and Family: Once a week    Attends Religious Services: Never    Database administrator or Organizations: Yes    Attends Engineer, structural: More than 4 times per year    Marital Status: Divorced  Intimate Partner Violence: Not At Risk (07/04/2023)   Humiliation, Afraid, Rape, and Kick questionnaire    Fear of Current or Ex-Partner: No    Emotionally Abused: No    Physically Abused: No    Sexually Abused: No    Past Surgical History:  Procedure Laterality Date   FOOT FRACTURE SURGERY     TOTAL ABDOMINAL HYSTERECTOMY  2022    Family History  Problem Relation Age of Onset   Esophageal cancer Mother    Diabetes Father    Heart disease Father    Diabetes Sister    Lung cancer Sister    Heart disease Maternal Grandfather    Diabetes Paternal Grandmother    Cancer Paternal Grandmother    Heart disease Paternal Grandfather    Diabetes Brother    Heart disease Brother    Breast cancer Neg Hx     No Known  Allergies  Current Outpatient Medications on File Prior to Visit  Medication Sig Dispense Refill   atorvastatin (LIPITOR) 40 MG tablet TAKE 1 TABLET BY MOUTH EVERY DAY FOR CHOLESTEROL 90 tablet 1   b complex vitamins capsule Take 1 capsule by mouth daily.     calcium-vitamin D (OSCAL WITH D) 500-200 MG-UNIT tablet Take 1 tablet by mouth.     cetirizine (ZYRTEC) 10 MG tablet Take 1 tablet (10 mg total) by mouth daily as needed for allergies. For allergies. 90 tablet 0   gabapentin (NEURONTIN) 300 MG capsule TAKE 1 CAPSULE (300 MG TOTAL) BY MOUTH 3 (THREE) TIMES DAILY. FOR NEUROPATHY. 270 capsule 3   glipiZIDE (GLUCOTROL) 10 MG tablet TAKE 1 TABLET (10 MG TOTAL) BY MOUTH 2 (TWO) TIMES DAILY  BEFORE A MEAL. FOR DIABETES. 180 tablet 1   lisinopril (ZESTRIL) 2.5 MG tablet TAKE 1 TABLET (2.5 MG TOTAL) BY MOUTH DAILY. FOR KIDNEY PROTECTION. 90 tablet 0   metFORMIN (GLUCOPHAGE-XR) 500 MG 24 hr tablet TAKE 2 TABLETS (1,000 MG TOTAL) BY MOUTH 2 (TWO) TIMES DAILY WITH A MEAL. FOR DIABETES. 360 tablet 1   Multiple Vitamin (MULTIVITAMIN) capsule Take 1 capsule by mouth daily.     traZODone (DESYREL) 50 MG tablet TAKE 1 TABLET (50 MG TOTAL) BY MOUTH AT BEDTIME. FOR SLEEP 90 tablet 0   No current facility-administered medications on file prior to visit.    BP 136/82   Pulse 75   Temp 97.7 F (36.5 C) (Temporal)   Ht 5\' 7"  (1.702 m)   Wt 198 lb (89.8 kg)   SpO2 99%   BMI 31.01 kg/m  Objective:   Physical Exam HENT:     Right Ear: There is impacted cerumen.     Left Ear: There is impacted cerumen.  Cardiovascular:     Rate and Rhythm: Normal rate and regular rhythm.  Pulmonary:     Effort: Pulmonary effort is normal.     Breath sounds: Normal breath sounds.  Abdominal:     General: Bowel sounds are normal.     Palpations: Abdomen is soft.     Tenderness: There is no abdominal tenderness.  Musculoskeletal:     Left forearm: Tenderness present. No swelling or bony tenderness.       Arms:      Cervical back: Neck supple.     Comments: Mild tenderness to the left abductor pollicus longus. No decrease in ROM.  Skin:    General: Skin is warm and dry.  Neurological:     Mental Status: She is alert and oriented to person, place, and time.  Psychiatric:        Mood and Affect: Mood normal.           Assessment & Plan:  Type 2 diabetes mellitus with diabetic mononeuropathy, without long-term current use of insulin (HCC) Assessment & Plan: Slightly deteriorated with A1C of 6.8.  Recommended GLP 1 agonist treatment for weight management and diabetes. She kindly declines.  Continue glipizide 10 mg BID and metformin ER 1000 mg BID. Continue lisinopril 2.5 mg for renal protection.   Urine microalbumin due and pending. Foot exam today.   Follow up in 6 months.  Orders: -     POCT glycosylated hemoglobin (Hb A1C) -     Microalbumin / creatinine urine ratio -     CBC  Chronic pain of right hip Assessment & Plan: Improved and controlled.   Continue gabapentin 300 mg BID to TID, continue diclofenac 75 mg PRN for which she uses sparingly. Continue to monitor.   Orders: -     Diclofenac Sodium; Take 1 tablet (75 mg total) by mouth at bedtime as needed for moderate pain (pain score 4-6).  Dispense: 90 tablet; Refill: 0  Spider veins of both lower extremities Assessment & Plan: Evaluated by vascular surgery.   Continue compression socks/stockings. Continue with proper hydration.    Pure hypercholesterolemia Assessment & Plan: Repeat lipid panel pending. Continue atorvastatin 40 mg daily.  Orders: -     Comprehensive metabolic panel -     Lipid panel  Paresthesias in right hand Assessment & Plan: Continued.  She will set up a visit with sports medicine.   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  Offered neurology referral  for which she declines as she is not bothered. She will update.   Screening mammogram for breast cancer -     3D  Screening Mammogram, Left and Right; Future  Bilateral impacted cerumen Assessment & Plan: Bilateral cerumen impaction identified on exam. Patient consented to irrigation of canals bilaterally.  Bilateral canals irrigated. Patient tolerated well. TM's and canals post irrigation unremarkable.   Discussed home care instructions.     Acute wrist pain, left Assessment & Plan: Likely tendonitis. No evidence of fracture.  Resume diclofenac 75 mg daily as needed. Discussed bracing for support. Discussed sports medicine evaluation if no improvement.   Encounter for immunization -     Flu Vaccine Trivalent High Dose (Fluad)        Doreene Nest, NP

## 2023-12-12 NOTE — Patient Instructions (Addendum)
Stop by the lab prior to leaving today. I will notify you of your results once received.   [facility]   Schedule a visit with Dr. Patsy Lager for your carpal tunnel symptoms.  Please schedule a follow up visit for 6 months for a diabetes check.  It was a pleasure to see you today!

## 2023-12-12 NOTE — Assessment & Plan Note (Signed)
Improved and controlled.   Continue gabapentin 300 mg BID to TID, continue diclofenac 75 mg PRN for which she uses sparingly. Continue to monitor.

## 2023-12-12 NOTE — Assessment & Plan Note (Signed)
Bilateral cerumen impaction identified on exam. Patient consented to irrigation of canals bilaterally.  Bilateral canals irrigated. Patient tolerated well. TM's and canals post irrigation unremarkable.   Discussed home care instructions.

## 2023-12-12 NOTE — Assessment & Plan Note (Signed)
Evaluated by vascular surgery.   Continue compression socks/stockings. Continue with proper hydration.

## 2023-12-12 NOTE — Assessment & Plan Note (Signed)
Continued.  She will set up a visit with sports medicine.

## 2023-12-12 NOTE — Assessment & Plan Note (Signed)
Repeat lipid panel pending. Continue atorvastatin 40 mg daily.

## 2023-12-12 NOTE — Assessment & Plan Note (Signed)
Chronic and continued.  Offered neurology referral for which she declines as she is not bothered. She will update.

## 2023-12-17 ENCOUNTER — Other Ambulatory Visit: Payer: Self-pay | Admitting: Primary Care

## 2023-12-17 DIAGNOSIS — R6889 Other general symptoms and signs: Secondary | ICD-10-CM

## 2023-12-17 NOTE — Telephone Encounter (Signed)
Called spoke with patient she stated she was going to try a month without the trazodone and update yo via MyChart with how she is doing, and if she wants to continue it or not.

## 2023-12-17 NOTE — Telephone Encounter (Signed)
 Please call patient:  I couldn't remember from our most recent conversation, but does she feel like she needs the Trazodone  at bedtime any longer for the vivid dreams? Especially since she's still having them?  The medication mostly induces sleep and since she is taking gabapentin , I don't want to cause too much drowsiness.   Let me know.

## 2023-12-17 NOTE — Telephone Encounter (Signed)
 Noted

## 2023-12-18 NOTE — Progress Notes (Signed)
 Jasara Corrigan T. Garan Frappier, MD, CAQ Sports Medicine [facility] [address] [place], [address]  Phone: [phone]  FAX: [phone]  Amber English - 68 y.o. female  MRN [id number]  Date of Birth: 04/11/1956  Date: 12/19/2023  PCP: Gretta Comer POUR, NP  Referral: Gretta Comer POUR, NP  Chief Complaint  Patient presents with   Wrist Pain    Bilateral   Subjective:   Amber English is a 68 y.o. very pleasant female patient with Body mass index is 31.03 kg/m. who presents with the following:  She presents for evaluation of possible right-sided carpal tunnel syndrome.  R wrist will go numb in the morning and it has been doing for about several years.  Fine motor skills will boter her.  Numbness will last for ab hour or so.  Sensation is down in the morning.  Shakes it out some.   Median distribution  De Q: No CTS symptoms on the L.   Collapsed on the floor when she had covid Trying to get up and she could not.  Pushing as hard as she could to get up.  That evening, muscles hurt really bad  She essentially only has symptoms in the first dorsal compartment on the left  Review of Systems is noted in the HPI, as appropriate  Objective:   BP 112/70 (BP Location: Left Arm, Patient Position: Sitting, Cuff Size: Large)   Pulse 86   Temp 98.6 F (37 C) (Temporal)   Ht 5' 7 (1.702 m)   Wt 198 lb 2 oz (89.9 kg)   SpO2 96%   BMI 31.03 kg/m   GEN: No acute distress; alert,appropriate. PULM: Breathing comfortably in no respiratory distress PSYCH: Normally interactive.   R hand Ecchymosis or edema: neg ROM wrist/hand/digits: full  Carpals, MCP's, digits: She does have some evidence of small joint arthritis Distal Ulna and Radius: NT Ecchymosis or edema: neg No instability Cysts/nodules: neg Digit triggering: neg Finkelstein's test: neg Snuffbox tenderness: neg Scaphoid tubercle: NT Resisted supination: NT Full composite  fist, no malrotation Grip, all digits: 5/5 str DIPJT: NT PIP JT: NT MCP JT: NT No tenosynovitis Axial load test: neg Phalen's: neg Tinel's: neg Atrophy: neg  Hand sensation: intact    EXTR: No clubbing/cyanosis/edema Normal gait  Hand: L Ecchymosis or edema: neg ROM wrist/hand/digits/elbow: full  Carpals, MCP's, digits: She does have some evidence of small joint arthritis Distal Ulna and Radius: NT Supination lift test: neg Ecchymosis or edema: neg Cysts/nodules: neg Finkelstein's test: POS Snuffbox tenderness: neg Scaphoid tubercle: NT Hook of Hamate: NT Resisted supination: NT Full composite fist Grip, all digits: 5/5 str No tenosynovitis Axial load test: neg Phalen's: neg Tinel's: neg Atrophy: neg  Hand sensation: intact   Laboratory and Imaging Data:  Assessment and Plan:     ICD-10-CM   1. Carpal tunnel syndrome, right  G56.01 triamcinolone  acetonide (KENALOG -40) injection 20 mg    2. De Quervain's tenosynovitis, left  M65.4      Classic presentation for median nerve entrapment consistent with carpal tunnel syndrome.  She does have a negative Tinel's and Phalen's, but she has a classic presentation with some numbness in digits 1 through 4 in the palmar surface as well as some intermittent tingling.  Recommended carpal tunnel brace at night.  Will also do a carpal tunnel injection today to.  Classic tenosynovitis of the L 1st dorsal compartment.  This anatomy was reviewed with the patient.  Ice bid  for 2-3 days at a minimum Place the thumb in a thumb spica splint Hand exercises in a week or so - stress ball then opening hand with rubber bands  If continues to do poorly, may need to inject the 1st dorsal sheath   Carpal Tunnel Injection Procedure Note Amber English 1956/05/28 Date of procedure: 12/19/2023  Procedure: Carpal Tunnel Injection, R Indications: Numbness  Procedure Details Verbal consent was obtained. Risks, benefits, and  alternatives were discussed. Prepped with Chloraprep and Ethyl Chloride used for anesthesia. Under sterile conditions,  the patient was injected just ulnar to the palmaris longus tendon at the wrist flexion crease.  The needle was inserted at 45 degree angle aiming distally. Aspiration showed no blood. Medication flowed freely without resistance.  Needle size: 22 gauge 1 1/2 inch Injection: 1/2 cc of Lidocaine 1% and Kenalog  20 mg Medication: 1/2 cc of Kenalog  40 mg (equaling Kenalog  20 mg)   Medication Management during today's office visit: Meds ordered this encounter  Medications   triamcinolone  acetonide (KENALOG -40) injection 20 mg   There are no discontinued medications.  Orders placed today for conditions managed today: No orders of the defined types were placed in this encounter.   Disposition: No follow-ups on file.  Dr[employer] One speech-to-text software was used for transcription in this dictation.  Possible transcriptional errors can occur using Animal nutritionist.   Signed,  Jacques DASEN. Earnstine Meinders, MD   Outpatient Encounter Medications as of 12/19/2023  Medication Sig   atorvastatin  (LIPITOR) 40 MG tablet TAKE 1 TABLET BY MOUTH EVERY DAY FOR CHOLESTEROL   b complex vitamins capsule Take 1 capsule by mouth daily.   calcium -vitamin D (OSCAL WITH D) 500-200 MG-UNIT tablet Take 1 tablet by mouth.   cetirizine  (ZYRTEC ) 10 MG tablet Take 1 tablet (10 mg total) by mouth daily as needed for allergies. For allergies.   diclofenac  (VOLTAREN ) 75 MG EC tablet Take 1 tablet (75 mg total) by mouth at bedtime as needed for moderate pain (pain score 4-6).   gabapentin  (NEURONTIN ) 300 MG capsule TAKE 1 CAPSULE (300 MG TOTAL) BY MOUTH 3 (THREE) TIMES DAILY. FOR NEUROPATHY.   glipiZIDE  (GLUCOTROL ) 10 MG tablet TAKE 1 TABLET (10 MG TOTAL) BY MOUTH 2 (TWO) TIMES DAILY BEFORE A MEAL. FOR DIABETES.   lisinopril  (ZESTRIL ) 2.5 MG tablet TAKE 1 TABLET (2.5 MG TOTAL) BY MOUTH DAILY. FOR KIDNEY  PROTECTION.   metFORMIN  (GLUCOPHAGE -XR) 500 MG 24 hr tablet TAKE 2 TABLETS (1,000 MG TOTAL) BY MOUTH 2 (TWO) TIMES DAILY WITH A MEAL. FOR DIABETES.   Multiple Vitamin (MULTIVITAMIN) capsule Take 1 capsule by mouth daily.   traZODone  (DESYREL ) 50 MG tablet TAKE 1 TABLET (50 MG TOTAL) BY MOUTH AT BEDTIME. FOR SLEEP   [EXPIRED] triamcinolone  acetonide (KENALOG -40) injection 20 mg    No facility-administered encounter medications on file as of 12/19/2023.

## 2023-12-19 ENCOUNTER — Ambulatory Visit: Payer: Medicare Other | Admitting: Family Medicine

## 2023-12-19 ENCOUNTER — Encounter: Payer: Self-pay | Admitting: Family Medicine

## 2023-12-19 VITALS — BP 112/70 | HR 86 | Temp 98.6°F | Ht 67.0 in | Wt 198.1 lb

## 2023-12-19 DIAGNOSIS — M654 Radial styloid tenosynovitis [de Quervain]: Secondary | ICD-10-CM

## 2023-12-19 DIAGNOSIS — G5601 Carpal tunnel syndrome, right upper limb: Secondary | ICD-10-CM | POA: Diagnosis not present

## 2023-12-19 MED ORDER — TRIAMCINOLONE ACETONIDE 40 MG/ML IJ SUSP
20.0000 mg | Freq: Once | INTRAMUSCULAR | Status: AC
  Administered 2023-12-19: 20 mg via INTRA_ARTICULAR

## 2023-12-19 NOTE — Patient Instructions (Signed)
 DeQuairvain's Tenosynovitis  Wear Thumb Spica Splint for 3 weeks: wear all the time. (OK to take off to shower)  Take antiinflammatories scheduled right now, such as Voltaren  Gel At least once a day, massage the area above your thumb with an ice cube for 10-20 minutes.  After 3 weeks, wean out of your splint. (Wear less and less each day)  Get a STRESS ball, practice squeezing it. Use a RUBBER BAND, practice opening it with your fingers and thumb.

## 2023-12-23 ENCOUNTER — Other Ambulatory Visit (HOSPITAL_BASED_OUTPATIENT_CLINIC_OR_DEPARTMENT_OTHER): Payer: Self-pay

## 2024-01-03 ENCOUNTER — Other Ambulatory Visit: Payer: Self-pay | Admitting: Primary Care

## 2024-01-03 DIAGNOSIS — E7800 Pure hypercholesterolemia, unspecified: Secondary | ICD-10-CM

## 2024-01-10 ENCOUNTER — Other Ambulatory Visit: Payer: Self-pay | Admitting: Primary Care

## 2024-01-10 DIAGNOSIS — E1141 Type 2 diabetes mellitus with diabetic mononeuropathy: Secondary | ICD-10-CM

## 2024-01-14 ENCOUNTER — Ambulatory Visit
Admission: RE | Admit: 2024-01-14 | Discharge: 2024-01-14 | Disposition: A | Discharge status: Home / Self Care | Payer: Medicare Other | Source: Ambulatory Visit | Attending: Primary Care

## 2024-01-14 DIAGNOSIS — Z1231 Encounter for screening mammogram for malignant neoplasm of breast: Secondary | ICD-10-CM

## 2024-02-05 ENCOUNTER — Other Ambulatory Visit: Payer: Self-pay | Admitting: Primary Care

## 2024-02-05 DIAGNOSIS — E1141 Type 2 diabetes mellitus with diabetic mononeuropathy: Secondary | ICD-10-CM

## 2024-02-12 ENCOUNTER — Other Ambulatory Visit: Payer: Self-pay | Admitting: Primary Care

## 2024-02-12 DIAGNOSIS — E1141 Type 2 diabetes mellitus with diabetic mononeuropathy: Secondary | ICD-10-CM

## 2024-02-26 ENCOUNTER — Other Ambulatory Visit: Payer: Self-pay | Admitting: Primary Care

## 2024-02-26 DIAGNOSIS — E1141 Type 2 diabetes mellitus with diabetic mononeuropathy: Secondary | ICD-10-CM

## 2024-04-08 ENCOUNTER — Other Ambulatory Visit: Payer: Self-pay | Admitting: Primary Care

## 2024-04-08 DIAGNOSIS — E1141 Type 2 diabetes mellitus with diabetic mononeuropathy: Secondary | ICD-10-CM

## 2024-05-19 NOTE — Progress Notes (Unsigned)
     Amber Weyers T. Cleatus Gabriel, MD, CAQ Sports Medicine [facility] [address] [place], [address]  Phone: [phone]  FAX: [phone]  Amber English - 68 y.o. female  MRN [id number]  Date of Birth: 1956/05/28  Date: 05/20/2024  PCP: Gretta Comer POUR, NP  Referral: Gretta Comer POUR, NP  No chief complaint on file.  Subjective:   Amber English is a 68 y.o. very pleasant female patient with There is no height or weight on file to calculate BMI. who presents with the following:  Patient is here for follow-up of wrist pain.  I saw her for carpal tunnel syndrome in February 2025 on the right side.  At that time, she also had some de Quervain's tenosynovitis on the left.    Review of Systems is noted in the HPI, as appropriate  Objective:   There were no vitals taken for this visit.  GEN: No acute distress; alert,appropriate. PULM: Breathing comfortably in no respiratory distress PSYCH: Normally interactive.   Laboratory and Imaging Data:  Assessment and Plan:   ***

## 2024-05-20 ENCOUNTER — Ambulatory Visit: Admitting: Family Medicine

## 2024-05-20 ENCOUNTER — Encounter: Payer: Self-pay | Admitting: Family Medicine

## 2024-05-20 ENCOUNTER — Ambulatory Visit (INDEPENDENT_AMBULATORY_CARE_PROVIDER_SITE_OTHER)
Admission: RE | Admit: 2024-05-20 | Discharge: 2024-05-20 | Disposition: A | Discharge status: Home / Self Care | Source: Ambulatory Visit | Attending: Family Medicine | Admitting: Family Medicine

## 2024-05-20 VITALS — BP 100/68 | HR 76 | Temp 98.0°F | Ht 67.0 in | Wt 202.0 lb

## 2024-05-20 DIAGNOSIS — M654 Radial styloid tenosynovitis [de Quervain]: Secondary | ICD-10-CM

## 2024-05-20 DIAGNOSIS — G5601 Carpal tunnel syndrome, right upper limb: Secondary | ICD-10-CM

## 2024-05-20 DIAGNOSIS — M25532 Pain in left wrist: Secondary | ICD-10-CM

## 2024-05-20 MED ORDER — TRIAMCINOLONE ACETONIDE 40 MG/ML IJ SUSP
20.0000 mg | Freq: Once | INTRAMUSCULAR | Status: AC
  Administered 2024-05-20: 20 mg via INTRA_ARTICULAR

## 2024-05-20 NOTE — Patient Instructions (Signed)
 Voltaren 1% gel, over the counter ?You can apply up to 4 times a day ? ?This can be applied to any joint: knee, wrist, fingers, elbows, shoulders, feet and ankles. ?Can apply to any tendon: tennis elbow, achilles, tendon, rotator cuff or any other tendon. ? ?Minimal is absorbed in the bloodstream: ok with oral anti-inflammatory or a blood thinner. ? ?Cost is about 9 dollars  ?

## 2024-05-25 ENCOUNTER — Other Ambulatory Visit: Payer: Self-pay

## 2024-05-25 ENCOUNTER — Encounter: Payer: Self-pay | Admitting: Neurology

## 2024-05-25 DIAGNOSIS — R202 Paresthesia of skin: Secondary | ICD-10-CM

## 2024-05-26 NOTE — Therapy (Signed)
 OUTPATIENT OCCUPATIONAL THERAPY ORTHO EVALUATION  Patient Name: Amber English MRN: [id number] DOB:Jul 11, 1956, 68 y.o., female Today's Date: 05/27/2024  PCP: Comer Gaskins, NP REFERRING PROVIDER: Dr. Jacques Copland  END OF SESSION:  OT End of Session - 05/27/24 1042     Visit Number 1    Number of Visits 13    Date for OT Re-Evaluation 07/11/24    Authorization Type Medicare A&B and [employer]--covered 100%    Authorization - Visit Number 1    Authorization - Number of Visits 10    Progress Note Due on Visit 10    OT Start Time 0802    OT Stop Time 0846    OT Time Calculation (min) 44 min    Activity Tolerance Patient tolerated treatment well    Behavior During Therapy WFL for tasks assessed/performed          Past Medical History:  Diagnosis Date   Diabetes mellitus without complication (HCC)    Hyperlipidemia    Melanoma in situ of scalp (HCC)    Past Surgical History:  Procedure Laterality Date   FOOT FRACTURE SURGERY     TOTAL ABDOMINAL HYSTERECTOMY  2022   Patient Active Problem List   Diagnosis Date Noted   Cerumen impaction 12/12/2023   Acute wrist pain, left 12/12/2023   Paresthesias in right hand 06/12/2023   Anxiety and depression 05/23/2022   Vivid dream 05/23/2022   Spider veins of both lower extremities 11/15/2021   Decreased hearing of both ears 03/28/2021   Type 2 diabetes mellitus with diabetic mononeuropathy, without long-term current use of insulin (HCC) 10/25/2020   HLD (hyperlipidemia) 10/25/2020   History of melanoma 10/25/2020    ONSET DATE: 12/2023  REFERRING DIAG: M65.4 (ICD-10-CM) - De Quervain's tenosynovitis, left M25.532 (ICD-10-CM) - Acute wrist pain, left  THERAPY DIAG:  Pain in left wrist  Muscle weakness (generalized)  Rationale for Evaluation and Treatment: Rehabilitation  SUBJECTIVE:   SUBJECTIVE STATEMENT: Pt reports that she collapsed x2 in December 2024 when she had Covid and may have strained it when getting  up when she went to the bathroom.  Pt reports pain at L radial wrist/forearm.  Wore forearm based thumb spica splint for 6 weeks.  Now pain at base of thumb, significant improvement after injection last week.  Pt accompanied by: self  PERTINENT HISTORY:  February 2025 noted de Quervain's tenosynovitis on the left per MD notes.  S/p injection for de Quervain's 05/20/2024.    Also referred to neurology due to worsening carpal tunnel syndrome on the right side for EMG and nerve conduction studies and anticipates consulting hand surgery.  Other PMH:  DM, hyperlipidemia, hx of Melanoma scalp, hx of vivid dreams      PRECAUTIONS: None  RED FLAGS: None   WEIGHT BEARING RESTRICTIONS: No  PAIN:  Are you having pain? Yes: NPRS scale: 1-3/10 Pain location: base of L thumb Pain description: achy, occasionally sharp now (improved from injection) Aggravating factors: movement Relieving factors: splint  FALLS: Has patient fallen in last 6 months? No  LIVING ENVIRONMENT: Lives with: lives with their family and lives alone  PLOF: Independent and pt cares for 1 y.o. grandchild and does cross stich  PATIENT GOALS:   NEXT MD VISIT: prn  OBJECTIVE:  Note: Objective measures were completed at Evaluation unless otherwise noted.  HAND DOMINANCE: Right  ADLs: Overall ADLs: [facility] for BADLs.  Pt reports difficulty with IADLs including picking up pots/pans, opening items, and caring/lifting 1 y.o. grandchild.  FUNCTIONAL OUTCOME MEASURES: Quick Dash: 22.7  UPPER EXTREMITY ROM:   AROM L wrist UD/RD, wrist flex/ext WNL with pain noted with UD and wrist flex, gross finger flex/ext and thumb ROM WNL   UPPER EXTREMITY MMT:   NT  HAND FUNCTION: Grip strength: Right: 56.8 lbs; Left: 51.8 lbs, Lateral pinch: Right: 19 lbs, Left: 14 lbs, 3 point pinch: Right: 15 lbs, Left: 13 lbs, and Tip pinch: Right 14 lbs, Left: 16 lbs  COORDINATION: WNL per pt report  SENSATION: Pt with paraesthesias in R  hand due to CTS  EDEMA: none noted  COGNITION: Overall cognitive status: Within functional limits for tasks assessed  OBSERVATIONS: Pt is point tender at base of thumb/radial wrist.   TREATMENT DATE: 05/27/24  Evaluation completed and education provided--see below.                                                                                                                             PATIENT EDUCATION: Education details: Initial HEP, positions to avoid and basic activity modifications for holding/lifting objects, incr wear of forearm-based thumb spica splint during the day with lifting, repetitive activities.  OT eval results and POC. Person educated: Patient Education method: Explanation, Demonstration, Verbal cues, and Handouts Education comprehension: verbalized understanding and returned demonstration  HOME EXERCISE PROGRAM: 05/27/24:  basic ROM HEP, positions to avoid and basic activity modifications for holding/lifting objects, incr wear of forearm-based thumb spica splint during the day with lifting, repetitive activities.  GOALS: Goals reviewed with patient? Yes  SHORT TERM GOALS: Target date: 06/27/24  Pt will be independent with ROM HEP. Goal status: INITIAL  2.  Pt will verbalize understanding of activity modifications and AE to decr pain. Goal status: INITIAL  3.  Pt will report performing AROM to L wrist/thumb without pain. Baseline:  0-3/10 Goal status: INITIAL   LONG TERM GOALS: Target date: 07/11/24  Pt will be independent with strengthening HEP. Goal status: INITIAL  2.  Pt will improve pain and L hand functional use as shown by improving score on Quick Dash to 10% or less. Baseline: 22.7 Goal status: INITIAL  3.  Pt will improve lateral pinch strength by at least 2lbs with L hand. Baseline:  14lbs Goal status: INITIAL     ASSESSMENT:  CLINICAL IMPRESSION: Patient is a 68 y.o. female who was seen today for occupational therapy evaluation for  L De Quervain's tenosynovitis.  Pt is independent and cares for 1 y.o. grandchild regularly.  Pt reports pain improved after recent injection, but is still having mild pain with L hand use.  Pt with PMH that includes:  DM, hyperlipidemia, hx of Melanoma scalp, hx of vivid dreams.  Pt also with R CTS and was referred to neurology for nerve conduction test.  Pt presents today with pain and decr strength affecting L hand functional use.  Pt would benefit from occupational therapy to address these deficits.  PERFORMANCE DEFICITS: in functional skills including ADLs,  IADLs, strength, pain, decreased knowledge of precautions, decreased knowledge of use of DME, and UE functional use, and psychosocial skills including habits.   IMPAIRMENTS: are limiting patient from ADLs, IADLs, and leisure.   COMORBIDITIES: may have co-morbidities  that affects occupational performance. Patient will benefit from skilled OT to address above impairments and improve overall function.  MODIFICATION OR ASSISTANCE TO COMPLETE EVALUATION: Min-Moderate modification of tasks or assist with assess necessary to complete an evaluation.  OT OCCUPATIONAL PROFILE AND HISTORY: Detailed assessment: Review of records and additional review of physical, cognitive, psychosocial history related to current functional performance.  CLINICAL DECISION MAKING: LOW - limited treatment options, no task modification necessary  REHAB POTENTIAL: Good  EVALUATION COMPLEXITY: Low      PLAN:  OT FREQUENCY: 2x/week  OT DURATION: 6 weeks +eval  PLANNED INTERVENTIONS: 97535 self care/ADL training, 02889 therapeutic exercise, 97530 therapeutic activity, 97112 neuromuscular re-education, 97140 manual therapy, 97035 ultrasound, 97018 paraffin, 02960 fluidotherapy, 97010 moist heat, 97010 cryotherapy, 97760 Orthotic Initial, 97763 Orthotic/Prosthetic subsequent, passive range of motion, patient/family education, and DME and/or AE  instructions  RECOMMENDED OTHER SERVICES: none at this time.  CONSULTED AND AGREED WITH PLAN OF CARE: Patient  PLAN FOR NEXT SESSION: review HEP and update, ultrasound, soft tissue mobs, continue with activity modification   Shakayla Hickox, OTR/L 05/27/2024, 10:44 AM

## 2024-05-27 ENCOUNTER — Encounter: Payer: Self-pay | Admitting: Occupational Therapy

## 2024-05-27 ENCOUNTER — Ambulatory Visit: Attending: Family Medicine | Admitting: Occupational Therapy

## 2024-05-27 DIAGNOSIS — M6281 Muscle weakness (generalized): Secondary | ICD-10-CM | POA: Diagnosis present

## 2024-05-27 DIAGNOSIS — M654 Radial styloid tenosynovitis [de Quervain]: Secondary | ICD-10-CM | POA: Insufficient documentation

## 2024-05-27 DIAGNOSIS — R208 Other disturbances of skin sensation: Secondary | ICD-10-CM | POA: Insufficient documentation

## 2024-05-27 DIAGNOSIS — M25532 Pain in left wrist: Secondary | ICD-10-CM | POA: Diagnosis present

## 2024-05-27 NOTE — Patient Instructions (Signed)
 Extension (Active With Finger Extension)    With forearm on table and wrist over edge, lift hand with fingers straight. Hold 3 seconds. Repeat 10 times. Do 3 sessions per day.   Extension (Passive)    Using other hand, lift hand at wrist as far as possible. Hold 20 seconds. Repeat 5 times. Do 3 sessions per day.   WRIST: Ulnar Deviation No Gravity    Rest arm and hand on surface, palm down. Move wrist away from body.  Keep thumb still, then move hand back to the middle. 10 reps, 3 times per day   AROM: Thumb Flexion / Extension    Actively bend right thumb across palm as far as possible. Hold 10 seconds. Relax. Then pull thumb back into hitchhike position. Repeat 10 times per set. Do 3 sessions per day.

## 2024-06-01 ENCOUNTER — Ambulatory Visit: Admitting: Occupational Therapy

## 2024-06-01 DIAGNOSIS — M25532 Pain in left wrist: Secondary | ICD-10-CM | POA: Diagnosis not present

## 2024-06-01 DIAGNOSIS — R208 Other disturbances of skin sensation: Secondary | ICD-10-CM

## 2024-06-01 DIAGNOSIS — M6281 Muscle weakness (generalized): Secondary | ICD-10-CM

## 2024-06-01 NOTE — Therapy (Signed)
 OUTPATIENT OCCUPATIONAL THERAPY ORTHO TREATMENT  Patient Name: Amber English MRN: [id number] DOB:1956/02/06, 68 y.o., female Today's Date: 06/01/2024  PCP: Comer Gaskins, NP REFERRING PROVIDER: Dr. Jacques Copland  END OF SESSION:  OT End of Session - 06/01/24 0936     Visit Number 2    Number of Visits 13    Date for OT Re-Evaluation 07/11/24    Authorization Type Medicare A&B and [employer]--covered 100%    Authorization - Number of Visits 10    Progress Note Due on Visit 10    OT Start Time 0935    OT Stop Time 1015    OT Time Calculation (min) 40 min    Activity Tolerance Patient tolerated treatment well    Behavior During Therapy WFL for tasks assessed/performed          Past Medical History:  Diagnosis Date   Diabetes mellitus without complication (HCC)    Hyperlipidemia    Melanoma in situ of scalp (HCC)    Past Surgical History:  Procedure Laterality Date   FOOT FRACTURE SURGERY     TOTAL ABDOMINAL HYSTERECTOMY  2022   Patient Active Problem List   Diagnosis Date Noted   Cerumen impaction 12/12/2023   Acute wrist pain, left 12/12/2023   Paresthesias in right hand 06/12/2023   Anxiety and depression 05/23/2022   Vivid dream 05/23/2022   Spider veins of both lower extremities 11/15/2021   Decreased hearing of both ears 03/28/2021   Type 2 diabetes mellitus with diabetic mononeuropathy, without long-term current use of insulin (HCC) 10/25/2020   HLD (hyperlipidemia) 10/25/2020   History of melanoma 10/25/2020    ONSET DATE: 12/2023  REFERRING DIAG: M65.4 (ICD-10-CM) - De Quervain's tenosynovitis, left M25.532 (ICD-10-CM) - Acute wrist pain, left  THERAPY DIAG:  Pain in left wrist  Muscle weakness (generalized)  Other disturbances of skin sensation  Rationale for Evaluation and Treatment: Rehabilitation  SUBJECTIVE:   SUBJECTIVE STATEMENT:  Pt is scheduled to have her nerve conduction study on August 2nd for her R hand.  Pt reports her R  hand tingles in the morning for about an hour.  She sleeps on her back.  Pt reports, she is wearing her 'new' wrist splint on L hand s/p leaving her splint at a restaurant.  She likes the new splint as it is supportive of her thumb and she can tighten the strap.  She has significantly improved comfort with L thumb after her injection ~ 05/20/24.  Pt reported she was on vacation and participated in a cross stitch event x 3 days and stitched 10+ hours/day with not much difficulty as she holds the canvas with her L hand and stitches with her R hand.  Pt reports she is getting a new car seat for her grandson and did ask for a swivel car seat to decrease strain on getting him in and out of the vehicle.   Pt accompanied by: self  PERTINENT HISTORY:  February 2025 noted de Quervain's tenosynovitis on the left per MD notes.  S/p injection for de Quervain's 05/20/2024.    Also referred to neurology due to worsening carpal tunnel syndrome on the right side for EMG and nerve conduction studies and anticipates consulting hand surgery.  Other PMH:  DM, hyperlipidemia, hx of Melanoma scalp, hx of vivid dream  PRECAUTIONS: None  RED FLAGS: None   WEIGHT BEARING RESTRICTIONS: No  PAIN:  Are you having pain? Yes: NPRS scale: 1-3/10 Pain location: base of L thumb Pain description:  achy, occasionally sharp now (improved from injection) Aggravating factors: movement Relieving factors: splint  FALLS: Has patient fallen in last 6 months? No  LIVING ENVIRONMENT: Lives with: lives with their family and lives alone  PLOF: Independent and pt cares for 1 y.o. grandchild and does cross stitch  PATIENT GOALS:   NEXT MD VISIT: prn  OBJECTIVE:  Note: Objective measures were completed at Evaluation unless otherwise noted.  HAND DOMINANCE: Right  ADLs: Overall ADLs: [facility] for BADLs.  Pt reports difficulty with IADLs including picking up pots/pans, opening items, and caring/lifting 1 y.o. grandchild.      FUNCTIONAL OUTCOME MEASURES: Quick Dash: 22.7  UPPER EXTREMITY ROM:   AROM L wrist UD/RD, wrist flex/ext WNL with pain noted with UD and wrist flex, gross finger flex/ext and thumb ROM WNL   UPPER EXTREMITY MMT:   NT  HAND FUNCTION: Grip strength: Right: 56.8 lbs; Left: 51.8 lbs, Lateral pinch: Right: 19 lbs, Left: 14 lbs, 3 point pinch: Right: 15 lbs, Left: 13 lbs, and Tip pinch: Right 14 lbs, Left: 16 lbs  COORDINATION: WNL per pt report  SENSATION: Pt with paraesthesias in R hand due to CTS  EDEMA: none noted  COGNITION: Overall cognitive status: Within functional limits for tasks assessed  OBSERVATIONS: Pt is point tender at base of thumb/radial wrist.   TREATMENT DATE: 06/01/24  Self Care: OT initiated education with patient on sleep positioning to reduce stress to upper extremity nerves, which could be attributing to reported paresthesias and pain in RUE extremity. Pt wears her thumb splint to bed on L hand and does have access to a wrist splint to trial on R hand.  Patient verbalized understanding.  OT initiated education with pt on joint protection principles as needed to improve UE pain. Patient encouraged to protect hands and wrist by  - Rest and Work Balance ie) balancing activities with appropriate rests during activity - Reduction of Effort - Use two hands instead of one if possible - Use of Larger/Stronger Joints Ie) Lift or carry with the forearm or shoulder rather than fingers - Use of Assistive Equipment - Consider splint use and AE equipment to protect joints from deformity and stresses  Therapeutic Exercises:  Pt issued tendon gliding exercises/handout with review of motions to isolate DIP, PIP and MCP joints for straight finger position, hook (DIP/PIP flexion), fist (DIP/PIP/MCP flexion), taco/duck (MCP flexion only) and flat fist (MCP and PIP flexion). Education provided on purpose of tendon glide exercises ie) to increase the circulation to the hand and  wrist, reduce swelling and promote healthier soft tissue for increased AROM and not to build hand or wrist strength at this time.   - Ultrasound completed for duration as noted below including:  Ultrasound applied to radial surface left wrist for 8 minutes, frequency of 3 MHz, 20% duty cycle, and 1.1 W/cm with pt's arm placed on soft towel for promotion of ROM, edema reduction, and pain reduction in affected extremity.  Pt reports improved comfort s/p massage effective of ultrasound treatment.  PATIENT EDUCATION: Education details: Initial HEP, positions to avoid and basic activity modifications for holding/lifting objects, incr wear of forearm-based thumb spica splint during the day with lifting, repetitive activities.  OT eval results and POC. Person educated: Patient Education method: Explanation, Demonstration, Verbal cues, and Handouts Education comprehension: verbalized understanding and returned demonstration  HOME EXERCISE PROGRAM: 05/27/24:  basic ROM HEP, positions to avoid and basic activity modifications for holding/lifting objects, incr wear of forearm-based thumb spica splint during the day with lifting, repetitive activities.  GOALS: Goals reviewed with patient? Yes  SHORT TERM GOALS: Target date: 06/27/24  Pt will be independent with ROM HEP. Goal status: IN Progress  2.  Pt will verbalize understanding of activity modifications and AE to decr pain. Goal status: IN Progress  3.  Pt will report performing AROM to L wrist/thumb without pain. Baseline:  0-3/10 Goal status: IN Progress   LONG TERM GOALS: Target date: 07/11/24  Pt will be independent with strengthening HEP. Goal status: INITIAL  2.  Pt will improve pain and L hand functional use as shown by improving score on Quick Dash to 10% or less. Baseline: 22.7 Goal status: INITIAL  3.  Pt will  improve lateral pinch strength by at least 2lbs with L hand. Baseline:  14lbs Goal status: INITIAL  ASSESSMENT:  CLINICAL IMPRESSION: Patient is a 68 y.o. female who was seen today for occupational therapy treatment for L De Quervain's tenosynovitis and probable R carpal tunnel syndrome.  Pt is independent and cares for 1 y.o. grandchild regularly.  Pt reports pain improved after recent injection, but is still having mild pain with L hand use.  Pt presents with pain and decr strength affecting L hand functional use.  Pt responded well to instruction today for HEP ideas, jt protection and task modification.  Pt will benefit from further occupational therapy to address deficits in strength, coordination and pain.  PERFORMANCE DEFICITS: in functional skills including ADLs, IADLs, strength, pain, decreased knowledge of precautions, decreased knowledge of use of DME, and UE functional use, and psychosocial skills including habits.   IMPAIRMENTS: are limiting patient from ADLs, IADLs, and leisure.   COMORBIDITIES: may have co-morbidities  that affects occupational performance. Patient will benefit from skilled OT to address above impairments and improve overall function.  REHAB POTENTIAL: Good  PLAN:  OT FREQUENCY: 2x/week  OT DURATION: 6 weeks +eval  PLANNED INTERVENTIONS: 97535 self care/ADL training, 02889 therapeutic exercise, 97530 therapeutic activity, 97112 neuromuscular re-education, 97140 manual therapy, 97035 ultrasound, 97018 paraffin, 02960 fluidotherapy, 97010 moist heat, 97010 cryotherapy, 97760 Orthotic Initial, 97763 Orthotic/Prosthetic subsequent, passive range of motion, patient/family education, and DME and/or AE instructions  RECOMMENDED OTHER SERVICES: none at this time.  CONSULTED AND AGREED WITH PLAN OF CARE: Patient  PLAN FOR NEXT SESSION:  review HEP and update, ultrasound, soft tissue mobs, continue with activity modification   Clarita LITTIE Pride, OTR/L 06/01/2024,  10:33 AM

## 2024-06-01 NOTE — Patient Instructions (Signed)
 SABRA

## 2024-06-04 ENCOUNTER — Ambulatory Visit: Admitting: Occupational Therapy

## 2024-06-04 DIAGNOSIS — M25532 Pain in left wrist: Secondary | ICD-10-CM

## 2024-06-04 DIAGNOSIS — R208 Other disturbances of skin sensation: Secondary | ICD-10-CM

## 2024-06-04 DIAGNOSIS — M6281 Muscle weakness (generalized): Secondary | ICD-10-CM

## 2024-06-04 NOTE — Therapy (Signed)
 OUTPATIENT OCCUPATIONAL THERAPY ORTHO TREATMENT  Patient Name: Amber English MRN: [id number] DOB:October 04, 1956, 68 y.o., female Today's Date: 06/04/2024  PCP: Comer Gaskins, NP REFERRING PROVIDER: Dr. Jacques Copland  END OF SESSION:  OT End of Session - 06/04/24 0849     Visit Number 3    Number of Visits 13    Date for OT Re-Evaluation 07/11/24    Authorization Type Medicare A&B and [employer]--covered 100%    Authorization - Number of Visits --    Progress Note Due on Visit 10    OT Start Time 0849    OT Stop Time 0930    OT Time Calculation (min) 41 min    Equipment Utilized During Treatment yellow putty    Activity Tolerance Patient tolerated treatment well    Behavior During Therapy WFL for tasks assessed/performed          Past Medical History:  Diagnosis Date   Diabetes mellitus without complication (HCC)    Hyperlipidemia    Melanoma in situ of scalp (HCC)    Past Surgical History:  Procedure Laterality Date   FOOT FRACTURE SURGERY     TOTAL ABDOMINAL HYSTERECTOMY  2022   Patient Active Problem List   Diagnosis Date Noted   Cerumen impaction 12/12/2023   Acute wrist pain, left 12/12/2023   Paresthesias in right hand 06/12/2023   Anxiety and depression 05/23/2022   Vivid dream 05/23/2022   Spider veins of both lower extremities 11/15/2021   Decreased hearing of both ears 03/28/2021   Type 2 diabetes mellitus with diabetic mononeuropathy, without long-term current use of insulin (HCC) 10/25/2020   HLD (hyperlipidemia) 10/25/2020   History of melanoma 10/25/2020    ONSET DATE: 12/2023  REFERRING DIAG: M65.4 (ICD-10-CM) - De Quervain's tenosynovitis, left M25.532 (ICD-10-CM) - Acute wrist pain, left  THERAPY DIAG:  Pain in left wrist  Muscle weakness (generalized)  Other disturbances of skin sensation  Rationale for Evaluation and Treatment: Rehabilitation  SUBJECTIVE:   SUBJECTIVE STATEMENT:  Pt is scheduled to have her nerve conduction  study on August 2nd for her R hand.  Pt arrived wearing her 'new' wrist splint on L hand and reports that it is comfortable.  Pt accompanied by: self  PERTINENT HISTORY:  February 2025 noted de Quervain's tenosynovitis on the left per MD notes.  S/p injection for de Quervain's 05/20/2024.    Also referred to neurology due to worsening carpal tunnel syndrome on the right side for EMG and nerve conduction studies and anticipates consulting hand surgery.  Other PMH:  DM, hyperlipidemia, hx of Melanoma scalp, hx of vivid dream  PRECAUTIONS: None  RED FLAGS: None   WEIGHT BEARING RESTRICTIONS: No  PAIN:  Are you having pain? No and Just tingles today - feels like it wants to go to sleep  FALLS: Has patient fallen in last 6 months? No  LIVING ENVIRONMENT: Lives with: lives with their family and lives alone  PLOF: Independent and pt cares for 1 y.o. grandchild and does cross stitch  PATIENT GOALS:   NEXT MD VISIT: prn  OBJECTIVE:  Note: Objective measures were completed at Evaluation unless otherwise noted.  HAND DOMINANCE: Right  ADLs: Overall ADLs: [facility] for BADLs.  Pt reports difficulty with IADLs including picking up pots/pans, opening items, and caring/lifting 1 y.o. grandchild.     FUNCTIONAL OUTCOME MEASURES: Quick Dash: 22.7  UPPER EXTREMITY ROM:   AROM L wrist UD/RD, wrist flex/ext WNL with pain noted with UD and wrist flex, gross finger  flex/ext and thumb ROM WNL   UPPER EXTREMITY MMT:   NT  HAND FUNCTION: Grip strength: Right: 56.8 lbs; Left: 51.8 lbs, Lateral pinch: Right: 19 lbs, Left: 14 lbs, 3 point pinch: Right: 15 lbs, Left: 13 lbs, and Tip pinch: Right 14 lbs, Left: 16 lbs  COORDINATION: WNL per pt report  SENSATION: Pt with paraesthesias in R hand due to CTS  EDEMA: none noted  COGNITION: Overall cognitive status: Within functional limits for tasks assessed  OBSERVATIONS: Pt is point tender at base of thumb/radial wrist.   TREATMENT DATE:  06/01/24  Self Care: - Therapeutic activities completed for duration as noted below including: Initiated Putty Exercises with yellow putty to begin gentle strengthening, coordination and sensory stimulation of B UEs.  Patient provided visual demonstration, verbal and tactile cues as needed to improve performance of the various exercises/activities including:   - Putty Squeezes - cues to squeeze putty into log for use with other exercises and to fold putty in half with 1 hand  - Putty Rolls - encourage to roll putty into logs with sensory stimulation to entire length of hand, fingers and wrist as needed   - Pinch and Pull with Putty - this motion is combined with different pinches (3-Point Pinch, Tip Pinch, Key Pinch) - patient encouraged to combine tripod, pincer and/or key pinch with pinch and pull motion of putty pulling away from midline, changing between different pinches and changing different directions to change grip.  Pt encouraged to monitor L thumb and work on rounded position of thumb opposition.  - Finger Extension with Putty - pt shown how to work on task with all fingers and thumb as well as individual fingers in opposition to thumb. Pt instructed on how to decreased effort with thinner loops of putty.  - Finger Adduction with Putty - pt shown how to work on weaving putty between fingers/thumb and then squeeze fingers together while laying hand flat on table top.  - Removing Objects from Putty  - encouraged to hide items (coins, marble, dice etc) and use one hand at a time to find the objects and identify them by tactile input before s/he digs them out and can see them visually.  OT educated patient on theraputty recommendations: avoid hot environments, place in designated container, avoid contact with fabrics. Patient verbalized understanding.    Patient benefited from extra time, verbal/tactile cues, and modeling of task to allow time for processing of verbal instructions and  improve motor planning of unfamiliar movements.   Therapeutic Exercises: Pt issued median nerve gliding exercises today due to numbness at times in L hand - thumb/index finger. Median nerve glides/stretching exercises were demonstrated and practiced to promote gentle gliding of the median nerve through its sheath.  Education provided on benefits including: helping improve nerve mobility, reducing pain/numbness in the arm/hand by relieving compression or irritation of the nerve and ultimately leading to improved nerve function and reduced discomfort. Patient return demonstrated each motion and verbalized understanding of instructions with handout provided.   Pt encouraged to set herself timers for rest breaks with tasks like cross stitching so she can stop to do tendon/median nerve glides.  PATIENT EDUCATION: Education details: Putty Activities and Median Nerve glides Person educated: Patient Education method: Programmer, multimedia, Demonstration, Verbal cues, and Handouts Education comprehension: verbalized understanding and returned demonstration  HOME EXERCISE PROGRAM: 05/27/24:  basic ROM HEP, positions to avoid and basic activity modifications for holding/lifting objects, incr wear of forearm-based thumb spica splint during the day with lifting, repetitive activities. 06/01/24: Tendon Glides 06/04/24: Median Nerve glides; Putty Activities Access Code: [email or web address] (texted to pt)  GOALS: Goals reviewed with patient? Yes  SHORT TERM GOALS: Target date: 06/27/24  Pt will be independent with ROM HEP. Goal status: IN Progress  2.  Pt will verbalize understanding of activity modifications and AE to decr pain. Goal status: IN Progress  3.  Pt will report performing AROM to L wrist/thumb without pain. Baseline:  0-3/10 Goal status: IN Progress   LONG TERM GOALS: Target date: 07/11/24  Pt will be independent  with strengthening HEP. Goal status: IN Progress  2.  Pt will improve pain and L hand functional use as shown by improving score on Quick Dash to 10% or less. Baseline: 22.7 Goal status: IN Progress  3.  Pt will improve lateral pinch strength by at least 2lbs with L hand. Baseline:  14lbs Goal status: IN Progress  ASSESSMENT:  CLINICAL IMPRESSION: Patient is a 68 y.o. female who was seen today for occupational therapy treatment for L De Quervain's tenosynovitis and probable R carpal tunnel syndrome.  Pt responded well to instruction today for HEP ideas with median nerve glides and gentle putty activities along with continued jt protection and task modification education.  Pt will benefit from further occupational therapy to address deficits in strength, coordination and pain.  PERFORMANCE DEFICITS: in functional skills including ADLs, IADLs, strength, pain, decreased knowledge of precautions, decreased knowledge of use of DME, and UE functional use, and psychosocial skills including habits.   IMPAIRMENTS: are limiting patient from ADLs, IADLs, and leisure.   COMORBIDITIES: may have co-morbidities  that affects occupational performance. Patient will benefit from skilled OT to address above impairments and improve overall function.  REHAB POTENTIAL: Good  PLAN:  OT FREQUENCY: 2x/week  OT DURATION: 6 weeks +eval  PLANNED INTERVENTIONS: 97535 self care/ADL training, 02889 therapeutic exercise, 97530 therapeutic activity, 97112 neuromuscular re-education, 97140 manual therapy, 97035 ultrasound, 97018 paraffin, 02960 fluidotherapy, 97010 moist heat, 97010 cryotherapy, 97760 Orthotic Initial, 97763 Orthotic/Prosthetic subsequent, passive range of motion, patient/family education, and DME and/or AE instructions  RECOMMENDED OTHER SERVICES: none at this time.  CONSULTED AND AGREED WITH PLAN OF CARE: Patient  PLAN FOR NEXT SESSION:  review HEP and update, ultrasound, soft tissue mobs,  continue with activity modification   Clarita LITTIE Pride, OTR/L 06/04/2024, 7:40 PM

## 2024-06-04 NOTE — Patient Instructions (Addendum)
 Access Code: [email or web address] URL: [email or web address] Date: 06/04/2024 Prepared by: Clarita Pride  Exercises - Putty Squeezes  - 1-2 x daily - 10 reps - Rolling Putty on Table  - 1-2 x daily - 10 reps - Finger Pinch and Pull with Putty  - 1-2 x daily - 10 reps - 3-Point Pinch with Putty  - 1-2 x daily - 10 reps - Tip PUSH with Putty  - 1-2 x daily - 10 reps - Key Pinch with Putty  - 1-2 x daily - 10 reps - Finger Extension with Putty  - 1-2 x daily - 10 reps - Finger Adduction with Putty  - 1-2 x daily - 10 reps - Removing Marbles from Putty  - 1-2 x daily - 10 reps

## 2024-06-08 ENCOUNTER — Ambulatory Visit: Admitting: Occupational Therapy

## 2024-06-08 DIAGNOSIS — M6281 Muscle weakness (generalized): Secondary | ICD-10-CM

## 2024-06-08 DIAGNOSIS — M25532 Pain in left wrist: Secondary | ICD-10-CM

## 2024-06-08 DIAGNOSIS — R208 Other disturbances of skin sensation: Secondary | ICD-10-CM

## 2024-06-08 NOTE — Patient Instructions (Signed)
 Amber English

## 2024-06-08 NOTE — Therapy (Signed)
 OUTPATIENT OCCUPATIONAL THERAPY ORTHO TREATMENT  Patient Name: Amber English MRN: [id number] DOB:October 12, 1956, 68 y.o., female Today's Date: 06/08/2024  PCP: Comer Gaskins, NP REFERRING PROVIDER: Dr. Jacques Copland  END OF SESSION:  OT End of Session - 06/08/24 0846     Visit Number 4    Number of Visits 13    Date for OT Re-Evaluation 07/11/24    Authorization Type Medicare A&B and [employer]--covered 100%    Progress Note Due on Visit 10    OT Start Time 0846    OT Stop Time 0939    OT Time Calculation (min) 53 min    Equipment Utilized During Treatment FM objects    Activity Tolerance Patient tolerated treatment well    Behavior During Therapy WFL for tasks assessed/performed          Past Medical History:  Diagnosis Date   Diabetes mellitus without complication (HCC)    Hyperlipidemia    Melanoma in situ of scalp (HCC)    Past Surgical History:  Procedure Laterality Date   FOOT FRACTURE SURGERY     TOTAL ABDOMINAL HYSTERECTOMY  2022   Patient Active Problem List   Diagnosis Date Noted   Cerumen impaction 12/12/2023   Acute wrist pain, left 12/12/2023   Paresthesias in right hand 06/12/2023   Anxiety and depression 05/23/2022   Vivid dream 05/23/2022   Spider veins of both lower extremities 11/15/2021   Decreased hearing of both ears 03/28/2021   Type 2 diabetes mellitus with diabetic mononeuropathy, without long-term current use of insulin (HCC) 10/25/2020   HLD (hyperlipidemia) 10/25/2020   History of melanoma 10/25/2020    ONSET DATE: 12/2023  REFERRING DIAG: M65.4 (ICD-10-CM) - De Quervain's tenosynovitis, left M25.532 (ICD-10-CM) - Acute wrist pain, left  THERAPY DIAG:  Pain in left wrist  Muscle weakness (generalized)  Other disturbances of skin sensation  Rationale for Evaluation and Treatment: Rehabilitation  SUBJECTIVE:   SUBJECTIVE STATEMENT:  Pt reports she is going well - even cross stitched without her brace and didn't have much  trouble.  She does not good improvements from a month ago ie) she is being more conscious of how she holds her hand and how to do things to protect her hands.  She is still not wearing a splint on the R hand yet at night.  Pt accompanied by: self  PERTINENT HISTORY:  February 2025 noted de Quervain's tenosynovitis on the left per MD notes.  S/p injection for de Quervain's 05/20/2024.    Also referred to neurology due to worsening carpal tunnel syndrome on the right side for EMG and nerve conduction studies and anticipates consulting hand surgery.  Other PMH:  DM, hyperlipidemia, hx of Melanoma scalp, hx of vivid dream  PRECAUTIONS: None  RED FLAGS: None   WEIGHT BEARING RESTRICTIONS: No  PAIN:  Are you having pain? No and Just tingles on R hand - not bad this weekend, teeny tiny bit in thumb/index finger  FALLS: Has patient fallen in last 6 months? No  LIVING ENVIRONMENT: Lives with: lives with their family and lives alone  PLOF: Independent and pt cares for 1 y.o. grandchild and does cross stitch  PATIENT GOALS:   NEXT MD VISIT: prn  OBJECTIVE:  Note: Objective measures were completed at Evaluation unless otherwise noted.  HAND DOMINANCE: Right  ADLs: Overall ADLs: [facility] for BADLs.  Pt reports difficulty with IADLs including picking up pots/pans, opening items, and caring/lifting 1 y.o. grandchild.     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   TREATMENT DATE: 06/08/24  - Therapeutic activities completed for duration as noted below including:  Coordination Exercise/Activity handout with images provided for various activities to work on B UE finger ROM, dexterity and isolated movements with demonstration and practice, as well as modification, hand over hand guidance and cues throughout to improve technique, digital isolation and ease of performing task.  Tasks included:  Pick up coins, dominoes, buttons, marbles, dried beans/pasta of different sizes ... To place in containers To stack - with guidance to work on include/isolate specific fingers. To pick up items one at a time until patient got 5+ in their hand and then move item from palm to fingertips to release ie) Finger-to-palm then palm-to-finger translation of small items - Options to vary difficulty include using a washcloth under items like coins or using larger items (checkers vs coins or blocks/dominos vs dice) for increased ease of picking up items.  Shuffling, Flipping and dealing cards 1 at a time. -- Setup patient to work on dealing cards, focusing on using index finger with thumb to flip cards or holding deck of cards in palm of hand and push off 1 card at a time from the top of the deck using only thumb OT educated pt on table top play of Golf Solitaire for BUE to address fine motor coordination and endurance/stamina. Pt required minimal cues for initial play.     Rotate golf balls (clockwise and counter-clockwise) with forearm pronated and balls on table or supinated and balls in hand.   Twirl pen/cil between fingers. - Encouragement to isolate fingers individually and twirl (rotation) or flipping and shift up and down the pen (translation) to get it in position for writing or erasing.    Tear a piece of paper towel and roll it into small balls with fingertips ie)  straw wrapping when eating out.    Patient is encouraged to take breaks, relax arm/shoulder by supporting forearm, minimize compensatory motions and a try different activities throughout the day/week including games like Londa (for the dice), card games, Connect 4 etc.   Patient benefited from extra time, verbal/tactile cues, and modeling of task to allow time for processing of verbal instructions and improve motor planning of different movements.    Self Care: OT educated pt on joint protection principles as noted in pt instructions as needed to improve UE pain.   Handout is provided regarding joint protection and patient is encouraged to consider the specific acronym LESS ie) less strain on joints  L: Listen to your body E: Energy Conservation S: Stronger Joints take the Lead S: Strategize   Patient encouraged to protect hands and wrist by  - Respecting for Pain and stopping activities before they reach the point of discomfort or pain  - Rest and Work Balance ie)  balancing activities with appropriate rests during activity - Reduction of Effort - Use two hands instead of one if possible  - Use of Larger/Stronger Joints Ie) Lift or carry with the forearm or shoulder rather than fingers - Avoid Activities That Cannot Be Stopped - Use of Assistive Equipment - Consider splint use and AE equipment to protect joints from deformity and stresses Then reviewed activities that can be modified with adaptive equipment and encourage patient to look adaptive equipment for arthritis [i.e. to consider joint protection].   Also provided low tech option for wrist stabilization - with folded washcloth inside a cut sock to stabilize the wrist at night until she can get a splint.                                                                                                   PATIENT EDUCATION: Education details: Actuary Person educated: Patient Education method:  Programmer, multimedia, Demonstration, Verbal cues, and Handouts Education comprehension: verbalized understanding and returned demonstration  HOME EXERCISE PROGRAM: 05/27/24:  basic ROM HEP, positions to avoid and basic activity modifications for holding/lifting objects, incr wear of forearm-based thumb spica splint during the day with lifting, repetitive activities. 06/01/24: Tendon Glides 06/04/24: Median Nerve glides; Putty Activities Access Code: [email or web address] (texted to pt) 06/08/24: Coordination Activities & Joint protection  GOALS: Goals reviewed with patient? Yes  SHORT TERM GOALS: Target date: 06/27/24  Pt will be independent with ROM HEP. Goal status: MET  2.  Pt will verbalize understanding of activity modifications and AE to decr pain. Goal status: IN Progress  3.  Pt will report performing AROM to L wrist/thumb without pain. Baseline:  0-3/10 Goal status: IN Progress   LONG TERM GOALS: Target date: 07/11/24  Pt will be independent with strengthening HEP. Goal status: IN Progress  2.  Pt will improve pain and L hand functional use as shown by improving score on Quick Dash to 10% or less. Baseline: 22.7 Goal status: IN Progress  3.  Pt will improve lateral pinch strength by at least 2lbs with L hand. Baseline:  14lbs Goal status: IN Progress  ASSESSMENT:  CLINICAL IMPRESSION: Patient is a 68 y.o. female who was seen today for occupational therapy treatment for L De Quervain's tenosynovitis and probable R carpal tunnel syndrome (EMG 8/22).  Pt responded well to instruction today for HEP ideas with coordination activities along with continued jt protection and task modification education.  Pt will benefit from further occupational therapy to address deficits in strength, coordination and pain.  PERFORMANCE DEFICITS: in functional skills including ADLs, IADLs, strength, pain, decreased knowledge of precautions, decreased knowledge of use of DME, and UE functional use, and psychosocial  skills including habits.   IMPAIRMENTS: are limiting patient from ADLs, IADLs, and leisure.   COMORBIDITIES: may have co-morbidities  that affects occupational performance. Patient will benefit from skilled OT to address above impairments and improve overall function.  REHAB POTENTIAL: Good  PLAN:  OT FREQUENCY: 2x/week  OT DURATION: 6 weeks +eval  PLANNED INTERVENTIONS: 97535 self care/ADL training, 02889 therapeutic exercise, 97530 therapeutic  activity, 97112 neuromuscular re-education, 97140 manual therapy, 97035 ultrasound, 02981 paraffin, 97039 fluidotherapy, 97010 moist heat, 97010 cryotherapy, 97760 Orthotic Initial, 97763 Orthotic/Prosthetic subsequent, passive range of motion, patient/family education, and DME and/or AE instructions  RECOMMENDED OTHER SERVICES: none at this time.  CONSULTED AND AGREED WITH PLAN OF CARE: Patient  PLAN FOR NEXT SESSION:  review HEP and update,  UBE ultrasound, soft tissue mobs,  continue with activity modification - kitchen etc   Clarita LITTIE Pride, OTR/L 06/08/2024, 9:45 AM

## 2024-06-10 ENCOUNTER — Encounter: Payer: Self-pay | Admitting: Primary Care

## 2024-06-10 ENCOUNTER — Ambulatory Visit (INDEPENDENT_AMBULATORY_CARE_PROVIDER_SITE_OTHER): Payer: Medicare Other | Admitting: Primary Care

## 2024-06-10 ENCOUNTER — Ambulatory Visit: Payer: Self-pay | Admitting: Primary Care

## 2024-06-10 VITALS — BP 104/62 | HR 76 | Temp 97.2°F | Ht 67.0 in | Wt 198.0 lb

## 2024-06-10 DIAGNOSIS — Z7984 Long term (current) use of oral hypoglycemic drugs: Secondary | ICD-10-CM | POA: Diagnosis not present

## 2024-06-10 DIAGNOSIS — E1141 Type 2 diabetes mellitus with diabetic mononeuropathy: Secondary | ICD-10-CM

## 2024-06-10 LAB — MICROALBUMIN / CREATININE URINE RATIO
Creatinine,U: 86.9 mg/dL
Microalb Creat Ratio: 12.5 mg/g (ref 0.0–30.0)
Microalb, Ur: 1.1 mg/dL (ref 0.0–1.9)

## 2024-06-10 LAB — POCT GLYCOSYLATED HEMOGLOBIN (HGB A1C): Hemoglobin A1C: 7.1 % — AB (ref 4.0–5.6)

## 2024-06-10 NOTE — Patient Instructions (Signed)
 Let me know what you find out regarding Ozempic  or Mounjaro.  Stop by the lab prior to leaving today. I will notify you of your results once received.   Schedule a lab only appointment for 3 months to recheck your A1c.  Schedule your annual follow-up visit with me for 6 months.  It was a pleasure to see you today!

## 2024-06-10 NOTE — Progress Notes (Signed)
 Subjective:    Patient ID: Amber English, female    DOB: 1956-05-13, 68 y.o.   MRN: [id number]  HPI  Amber English is a very pleasant 68 y.o. female type 2 diabetes, hyperlipidemia who presents today for follow-up of diabetes.  Current medications include: Glipizide  10 mg twice daily, metformin  ER 1000 mg twice daily  She is checking her blood glucose once weekly on average and is getting readings of:  AM fasting: 120-130  Last A1C: 6.8 in January 2025, 7.1 today Last Eye Exam: Up-to-date Last Foot Exam: Up-to-date Pneumonia Vaccination: 2024 Urine Microalbumin: Due Statin: Atorvastatin   Dietary changes since last visit: Overall healthy diet, no significant change. She has been traveling and attending social events.    Exercise: None   Wt Readings from Last 3 Encounters:  06/10/24 198 lb (89.8 kg)  05/20/24 202 lb (91.6 kg)  12/19/23 198 lb 2 oz (89.9 kg)     Review of Systems  Respiratory:  Negative for shortness of breath.   Cardiovascular:  Negative for chest pain.  Neurological:  Positive for numbness.         Past Medical History:  Diagnosis Date   Diabetes mellitus without complication (HCC)    Hyperlipidemia    Melanoma in situ of scalp (HCC)     Social History   Socioeconomic History   Marital status: Divorced    Spouse name: Not on file   Number of children: Not on file   Years of education: Not on file   Highest education level: 12th grade  Occupational History   Not on file  Tobacco Use   Smoking status: Never   Smokeless tobacco: Never  Vaping Use   Vaping status: Never Used  Substance and Sexual Activity   Alcohol use: Yes    Comment: occasional   Drug use: Not Currently   Sexual activity: Not Currently    Birth control/protection: None  Other Topics Concern   Not on file  Social History Narrative   Live alone with dog   Social Drivers of Health   Financial Resource Strain: Low Risk  (05/19/2024)   Overall Financial  Resource Strain (CARDIA)    Difficulty of Paying Living Expenses: Not hard at all  Food Insecurity: No Food Insecurity (05/19/2024)   Hunger Vital Sign    Worried About Running Out of Food in the Last Year: Never true    Ran Out of Food in the Last Year: Never true  Transportation Needs: No Transportation Needs (05/19/2024)   PRAPARE - Administrator, Civil Service (Medical): No    Lack of Transportation (Non-Medical): No  Physical Activity: Insufficiently Active (05/19/2024)   Exercise Vital Sign    Days of Exercise per Week: 2 days    Minutes of Exercise per Session: 20 min  Stress: No Stress Concern Present (05/19/2024)   [employer] of Occupational Health - Occupational Stress Questionnaire    Feeling of Stress: Not at all  Social Connections: Moderately Isolated (05/19/2024)   Social Connection and Isolation Panel    Frequency of Communication with Friends and Family: More than three times a week    Frequency of Social Gatherings with Friends and Family: Twice a week    Attends Religious Services: Never    Database administrator or Organizations: Yes    Attends Banker Meetings: Not on file    Marital Status: Divorced  Intimate Partner Violence: Not At Risk (07/04/2023)   Humiliation, Afraid,  Rape, and Kick questionnaire    Fear of Current or Ex-Partner: No    Emotionally Abused: No    Physically Abused: No    Sexually Abused: No    Past Surgical History:  Procedure Laterality Date   FOOT FRACTURE SURGERY     TOTAL ABDOMINAL HYSTERECTOMY  2022    Family History  Problem Relation Age of Onset   Esophageal cancer Mother    Diabetes Father    Heart disease Father    Diabetes Sister    Lung cancer Sister    Heart disease Maternal Grandfather    Diabetes Paternal Grandmother    Cancer Paternal Grandmother    Heart disease Paternal Grandfather    Diabetes Brother    Heart disease Brother    Breast cancer Neg Hx     No Known Allergies  Current  Outpatient Medications on File Prior to Visit  Medication Sig Dispense Refill   atorvastatin  (LIPITOR) 40 MG tablet TAKE 1 TABLET BY MOUTH EVERY DAY FOR CHOLESTEROL 90 tablet 3   b complex vitamins capsule Take 1 capsule by mouth daily.     calcium -vitamin D (OSCAL WITH D) 500-200 MG-UNIT tablet Take 1 tablet by mouth.     cetirizine  (ZYRTEC ) 10 MG tablet Take 1 tablet (10 mg total) by mouth daily as needed for allergies. For allergies. 90 tablet 0   diclofenac  (VOLTAREN ) 75 MG EC tablet Take 1 tablet (75 mg total) by mouth at bedtime as needed for moderate pain (pain score 4-6). 90 tablet 0   gabapentin  (NEURONTIN ) 300 MG capsule TAKE 1 CAPSULE (300 MG TOTAL) BY MOUTH 3 (THREE) TIMES DAILY. FOR NEUROPATHY. 270 capsule 2   glipiZIDE  (GLUCOTROL ) 10 MG tablet TAKE 1 TABLET (10 MG TOTAL) BY MOUTH 2 (TWO) TIMES DAILY BEFORE A MEAL. FOR DIABETES. 180 tablet 1   lisinopril  (ZESTRIL ) 2.5 MG tablet TAKE 1 TABLET (2.5 MG TOTAL) BY MOUTH DAILY. FOR KIDNEY PROTECTION. 90 tablet 2   metFORMIN  (GLUCOPHAGE -XR) 500 MG 24 hr tablet TAKE 2 TABLETS (1,000 MG TOTAL) BY MOUTH 2 (TWO) TIMES DAILY WITH A MEAL. FOR DIABETES. 360 tablet 1   Multiple Vitamin (MULTIVITAMIN) capsule Take 1 capsule by mouth daily.     No current facility-administered medications on file prior to visit.    BP 104/62   Pulse 76   Temp (!) 97.2 F (36.2 C) (Temporal)   Ht 5' 7 (1.702 m)   Wt 198 lb (89.8 kg)   SpO2 97%   BMI 31.01 kg/m  Objective:   Physical Exam Cardiovascular:     Rate and Rhythm: Normal rate and regular rhythm.  Pulmonary:     Effort: Pulmonary effort is normal.     Breath sounds: Normal breath sounds.  Musculoskeletal:     Cervical back: Neck supple.  Skin:    General: Skin is warm and dry.  Neurological:     Mental Status: She is alert and oriented to person, place, and time.  Psychiatric:        Mood and Affect: Mood normal.           Assessment & Plan:  Type 2 diabetes mellitus with diabetic  mononeuropathy, without long-term current use of insulin (HCC) Assessment & Plan: Slightly deteriorated with A1C of 7.1 today.  We discussed GLP 1 agonist options including Ozempic  and Mounjaro for weight loss and diabetes benefits.  She will speak with her insurance company regarding coverage first.  Continue metformin  ER 1000 mg twice daily, glipizide  10  mg twice daily. Urine microalbumin done pending.  Repeat A1c in 3 months, follow-up in 6 months.  Orders: -     POCT glycosylated hemoglobin (Hb A1C) -     Microalbumin / creatinine urine ratio        Comer MARLA Gaskins, NP

## 2024-06-10 NOTE — Assessment & Plan Note (Signed)
 Slightly deteriorated with A1C of 7.1 today.  We discussed GLP 1 agonist options including Ozempic  and Mounjaro for weight loss and diabetes benefits.  She will speak with her insurance company regarding coverage first.  Continue metformin  ER 1000 mg twice daily, glipizide  10 mg twice daily. Urine microalbumin done pending.  Repeat A1c in 3 months, follow-up in 6 months.

## 2024-06-12 ENCOUNTER — Ambulatory Visit: Attending: Family Medicine | Admitting: Occupational Therapy

## 2024-06-12 DIAGNOSIS — R208 Other disturbances of skin sensation: Secondary | ICD-10-CM | POA: Insufficient documentation

## 2024-06-12 DIAGNOSIS — M6281 Muscle weakness (generalized): Secondary | ICD-10-CM | POA: Insufficient documentation

## 2024-06-12 DIAGNOSIS — M25532 Pain in left wrist: Secondary | ICD-10-CM | POA: Insufficient documentation

## 2024-06-12 DIAGNOSIS — R278 Other lack of coordination: Secondary | ICD-10-CM | POA: Insufficient documentation

## 2024-06-12 NOTE — Patient Instructions (Signed)
[id number] (8/07) - Page 1 of 6 [facility]  Rehabilitation and Sports Medicine Services Joint Protection Principles Therapist Phone  Joint protection principles are a series of techniques which can be included into all activities. This will reduce the stress on your joints. Joints that have been weakened by arthritis are at risk of being damaged by stress and strain. Improper use of diseased joints may lead to impaired function and deformity. Joint protection techniques are ways of doing activities so that the risk of deformity is decreased. 1. Respect For Pain Stop activities before you reach the point of discomfort or pain. Limit activities which cause your pain to last more than one hour after you have stopped the activity. 2. Balance Activity And Rest Rest before becoming tired. Plan rest periods during longer or more difficult activities. By resting 10 minutes during an activity, you will have more energy to continue. 3. Avoid Activities Which Cannot Be Stopped When you begin to feel joint pain, stop. This will eliminate excessive pain and fatigue later. Prioritize activities. Consider the activity, length of time, and difficulty before beginning. Plan difficult activities for "peak" energy times. 4. Use Larger, Stronger Joints For Activities, When Possible, Distributing The Weight Over Non-involved Or Stronger Joints.  To lift a bag from a counter, bend your knees, hug the bag with both arms. Bend your elbows so that the bag is held tightly to your chest and straighten your knees. Keep hold on the bag by keeping your elbows bent. If the load is too heavy, push shopping cart, or get help with groceries - use drive-up service.  You can use your hip to push open doors, and your feet to close lower drawers. OVER  [id number] (8/07) - Page 2 of 6  An envelope briefcase with a snap lock can be used rather than an attache case. By bending the elbows, the case can be carried under the arm so  that the case rests on the forearm. Hold the case by resting your arm against your body. Switch the case from one side of the body to the other.  Use the larger joints (elbow or shoulder) to carry the weight of the purse.  Wrong: The weight of the purse is all on the weak fingers.  Wing faucets: Keeping wrists extended, use the palm of your left hand to turn on a left faucet and the back of your left hand to turn the faucet off.  Four-pronged or circular faucets: Place palm of hand on top of the faucet keeping fingers extended. Straighten your elbow and apply a downward force on faucet, pushing from your shoulder. Keeping fingers and elbow extended, turn your arm inward toward your thumb. Right: The stronger elbow should carry the weight of the purse. CONTINUED ON NEXT PAGE W11914 (8/07) - Page 3 of 6  Wrong: Do not use fingers to lift heavy roasting pans or dishes.  Wrong: All the weight of the pot would on your weak fingers. 5. Avoid Staying In One Position For Extended Periods Of Time. Plan rest periods. Change your position. Stretch and relax your joints. 6. Maintain Or Use Your Joints In Good Alignment. Maintain proper posture.  This is good alignment. Right: Use oven mitts and lift with palms, using the stronger wrists and elbows to do the work. Right: Pick up the pot with two hands, using your palms. Avoid or change activities that cause your fingers to move towards the little finger side of your hand. OVER  [id number] (8/07) - Page 4 of 6  Use the palms of your hands for lifting and pushing. Push instead of pulling. 7. Maintain Proper Weight. Additional weight can stress weight-bearing joints (hip, knees, feet, back). Special Considerations For The Hands  1. AVOID TIGHT GRASP. Use a relaxed grip. Enlarge handles.  Place palm of hand on jar lid, and using weight if body, turn arm at shoulder to open jar. A sponge or wet towel under the jar prevents sliding.  2. AVOID PRESSURE ON  BACK OF KNUCKLES (MP JOINTS).  Wrong Right When rising from chair or bed. Dishwashing should be done with fingers kept straight as much as possible. It a dishwasher is available, use it in preference to washing by hand. Hold the knife or mixing spoon like a dagger, with the handle parallel to knuckles. Cutting is then changed from sawing to pulling. CONTINUED ON NEXT PAGE [id number] (8/07) - Page 5 of 6  3. USE BOTH HANDS WHEN POSSIBLE  4. AVOID REPETITIVE HAND ACTIVITIES Take breaks Change activity, i.e. using screwdriver, crocheting.  5. AVOID PRESSURE TO TIP OF THUMB Example: pushing snaps together, opening car doors, ringing doorbells.  To protect thumb joints, open milk containers with heels of the hands rather than thumbs. PostureWhether walking, standing, sitting or even sleeping, good posture is important for people with arthritis. Poor posture can make arthritis worse. As for standing, you should stand straight, head high, shoulders back, stomach in, and hips and knees straight. WalkingWalk erect, as in standing position. Arm swing freely at sides; let your weight shift easily from side to the other. Don't carry heavy packages in one hand. A lightweight shoulder bag is a good idea. If legs or knees are involved, a cane will make walking easier. Ring top cans: Hold the can with one hand. With the other hand, place a knife through the ring with handle of knife directly over the opening. Using the palm of your hand, push down on the handle of the knife. OVER W11914 (8/07) - Page 6 of 6  Resting/SleepingPatients with rheumatoid arthritis should avoid bent knees or arms. Lie straight at sides, knees and hips straight. Use a firm mattress or put plywood board between mattress and bedspring. If you need a pillow under your head, use a thin one. Keep sheets and blankets loose over your feet, perhaps by using a blanket support. If your arthritis is in your back, you may need a different position for  sleeping. Ask your doctor. SittingKeep good posture when sitting down. Use straight-back armchairs with firm seats. Sit with head up, shoulders back, stomach in, feet flat on floor. Use arms of chair to stand up slowly. References AOTA's, Workbook for Consumers with R.A.   Cordery, Vanita Panda, M.A.O.T., OTR, "Joint Protection - A Responsibility for the Occupational Therapist," American Journal of Occupational Therapy, XIX, %, 1965.   "Joint Protection," Occupational Therapy Department, [facility], [place], [place].   Alesia Morin: Rheumatic Disease Occupational Therapy and Rehab, [phone].   "Principles of Joint Protection," Occupational Therapy Department, [facility], [place], [place].   [employer], 5621.   Sammuel Cooper, OTR, and Tonye Pearson, OTR, "Joint Preservation Techniques for Patients with Rheumatoid Arthritis," Department of Occupational Therapy, [facility], [place], [place].   You can place your arms out to the sides with pillows underneath or place a pillow across your stomach with your hands resting on top for support.   Hold a pillow with  your arms away from your body.  Do not bend your arms at the elbows or tuck your hands under your head. Do not place your hand on top or underneath pillow. Use outside of pillow as a border.

## 2024-06-12 NOTE — Therapy (Signed)
 OUTPATIENT OCCUPATIONAL THERAPY ORTHO TREATMENT  Patient Name: Amber English MRN: [id number] DOB:12-22-1955, 68 y.o., female Today's Date: 06/12/2024  PCP: Comer Gaskins, NP REFERRING PROVIDER: Dr. Jacques Copland  END OF SESSION:  OT End of Session - 06/12/24 1237     Visit Number 5    Number of Visits 13    Date for OT Re-Evaluation 07/11/24    Authorization Type Medicare A&B and [employer]--covered 100%    Progress Note Due on Visit 10    OT Start Time 1237    OT Stop Time 1319    OT Time Calculation (min) 42 min    Activity Tolerance Patient tolerated treatment well    Behavior During Therapy WFL for tasks assessed/performed         Past Medical History:  Diagnosis Date   Diabetes mellitus without complication (HCC)    Hyperlipidemia    Melanoma in situ of scalp (HCC)    Past Surgical History:  Procedure Laterality Date   FOOT FRACTURE SURGERY     TOTAL ABDOMINAL HYSTERECTOMY  2022   Patient Active Problem List   Diagnosis Date Noted   Cerumen impaction 12/12/2023   Acute wrist pain, left 12/12/2023   Paresthesias in right hand 06/12/2023   Anxiety and depression 05/23/2022   Vivid dream 05/23/2022   Spider veins of both lower extremities 11/15/2021   Decreased hearing of both ears 03/28/2021   Type 2 diabetes mellitus with diabetic mononeuropathy, without long-term current use of insulin (HCC) 10/25/2020   HLD (hyperlipidemia) 10/25/2020   History of melanoma 10/25/2020   ONSET DATE: 12/2023  REFERRING DIAG: M65.4 (ICD-10-CM) - De Quervain's tenosynovitis, left M25.532 (ICD-10-CM) - Acute wrist pain, left  THERAPY DIAG:  Pain in left wrist  Muscle weakness (generalized)  Other disturbances of skin sensation  Rationale for Evaluation and Treatment: Rehabilitation  SUBJECTIVE:   SUBJECTIVE STATEMENT:  Pt reports doing better with BUE pain. More conscious of how she is using her hands.  Pt accompanied by: self  PERTINENT HISTORY:  February  2025 noted de Quervain's tenosynovitis on the left per MD notes.  S/p injection for de Quervain's 05/20/2024.    Also referred to neurology due to worsening carpal tunnel syndrome on the right side for EMG and nerve conduction studies and anticipates consulting hand surgery.  Other PMH:  DM, hyperlipidemia, hx of Melanoma scalp, hx of vivid dream  PRECAUTIONS: None  RED FLAGS: None   WEIGHT BEARING RESTRICTIONS: No  PAIN:  Are you having pain? Yes: NPRS scale: 4/10 Pain location: B hands Pain description: sore Aggravating factors: certain movements/use Relieving factors: bracing, better ergonomics  FALLS: Has patient fallen in last 6 months? No  LIVING ENVIRONMENT: Lives with: lives with their family and lives alone  PLOF: Independent and pt cares for 1 y.o. grandchild and does cross stitch  PATIENT GOALS:   NEXT MD VISIT: prn  OBJECTIVE:  Note: Objective measures were completed at Evaluation unless otherwise noted.  HAND DOMINANCE: Right  ADLs: Overall ADLs: [facility] for BADLs.  Pt reports difficulty with IADLs including picking up pots/pans, opening items, and caring/lifting 1 y.o. grandchild.     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  TREATMENT:   Self Care: OT educated pt on joint protection, ergonomics, sleep positioning and body mechanic  principles as noted in pt instructions as needed to improve UE pain.    Kinesio tape to B dorsal wrists using space correction technique with 40-60% stretch. Pt verbalized understanding of tape and wearing schedule with application on affected extremities.  OT educated patient on how to obtain Kinesiotape or similar tape for home completion if beneficial.   - Ultrasound completed for duration as noted below including:  Ultrasound applied to palmar and dorsal left hand, wrist, and forearm for 8 minutes, frequency of 3 MHz, 20% duty cycle, and 1.1 W/cm with pt's arm placed on soft towel for promotion of ROM, edema reduction, and pain reduction in affected extremity.                  PATIENT EDUCATION: Education details: Actuary Person educated: Patient Education method: Programmer, multimedia, Demonstration, Verbal cues, and Handouts Education comprehension: verbalized understanding and returned demonstration  HOME EXERCISE PROGRAM: 05/27/24:  basic ROM HEP, positions to avoid and basic activity modifications for holding/lifting objects, incr wear of forearm-based thumb spica splint during the day with lifting, repetitive activities. 06/01/24: Tendon Glides 06/04/24: Median Nerve glides; Putty Activities Access Code: [email or web address] (texted to pt) 06/08/24: Coordination Activities & Joint protection  GOALS: Goals reviewed with patient? Yes  SHORT TERM GOALS: Target date: 06/27/24  Pt will be independent with ROM HEP. Goal status: MET  2.  Pt will verbalize understanding of activity modifications and AE to decr pain. Goal status: IN Progress  3.  Pt will report performing AROM to L wrist/thumb without pain. Baseline:  0-3/10 Goal status: IN Progress   LONG TERM GOALS: Target date: 07/11/24  Pt will be independent with strengthening HEP. Goal status: IN Progress  2.  Pt will improve pain and L hand functional use as shown by improving score on Quick Dash to 10% or  less. Baseline: 22.7 Goal status: IN Progress  3.  Pt will improve lateral pinch strength by at least 2lbs with L hand. Baseline:  14lbs Goal status: IN Progress  ASSESSMENT:  CLINICAL IMPRESSION: Patient demonstrates good understanding of handouts this session as needed to reduce BUE pain. Recommend use of kinesio-tape for further pain reduction efforts. General approach taken and pt can complete B; however, pt may get more benefit from more specific strategy on the L. Will assess benefit and need at next session.   PERFORMANCE DEFICITS: in functional skills including ADLs, IADLs, strength, pain, decreased knowledge of precautions, decreased knowledge of use of DME, and UE functional use, and psychosocial skills including habits.   IMPAIRMENTS: are limiting patient from ADLs, IADLs, and leisure.   COMORBIDITIES: may have co-morbidities  that affects occupational performance. Patient will benefit from skilled OT to address above impairments and improve overall function.  REHAB POTENTIAL: Good  PLAN:  OT FREQUENCY: 2x/week  OT DURATION: 6 weeks +eval  PLANNED INTERVENTIONS: 97535 self care/ADL training, 02889 therapeutic exercise, 97530 therapeutic activity, 97112 neuromuscular re-education, 97140 manual therapy, 97035 ultrasound, 97018 paraffin, 02960 fluidotherapy, 97010 moist heat, 97010 cryotherapy, 97760  Orthotic Initial, 02236 Orthotic/Prosthetic subsequent, passive range of motion, patient/family education, and DME and/or AE instructions  RECOMMENDED OTHER SERVICES: none at this time.  CONSULTED AND AGREED WITH PLAN OF CARE: Patient  PLAN FOR NEXT SESSION:  review HEP and update  UBE Ultrasound (was this helpful on L?), soft tissue mobs,  continue with activity modification - kitchen etc   Jocelyn CHRISTELLA Bottom, OTR/L 06/12/2024, 1:45 PM

## 2024-06-15 ENCOUNTER — Ambulatory Visit: Admitting: Occupational Therapy

## 2024-06-15 DIAGNOSIS — M25532 Pain in left wrist: Secondary | ICD-10-CM

## 2024-06-15 DIAGNOSIS — M6281 Muscle weakness (generalized): Secondary | ICD-10-CM

## 2024-06-15 DIAGNOSIS — R208 Other disturbances of skin sensation: Secondary | ICD-10-CM

## 2024-06-15 NOTE — Therapy (Unsigned)
 OUTPATIENT OCCUPATIONAL THERAPY ORTHO TREATMENT  Patient Name: Amber English MRN: [id number] DOB:09/15/1956, 68 y.o., female Today's Date: 06/15/2024  PCP: Comer Gaskins, NP REFERRING PROVIDER: Dr. Jacques Copland  END OF SESSION:  OT End of Session - 06/15/24 0934     Visit Number 6    Number of Visits 13    Date for OT Re-Evaluation 07/11/24    Authorization Type Medicare A&B and [employer]--covered 100%    Progress Note Due on Visit 10    OT Start Time 0934    OT Stop Time 1015    OT Time Calculation (min) 41 min    Activity Tolerance Patient tolerated treatment well    Behavior During Therapy WFL for tasks assessed/performed         Past Medical History:  Diagnosis Date   Diabetes mellitus without complication (HCC)    Hyperlipidemia    Melanoma in situ of scalp (HCC)    Past Surgical History:  Procedure Laterality Date   FOOT FRACTURE SURGERY     TOTAL ABDOMINAL HYSTERECTOMY  2022   Patient Active Problem List   Diagnosis Date Noted   Cerumen impaction 12/12/2023   Acute wrist pain, left 12/12/2023   Paresthesias in right hand 06/12/2023   Anxiety and depression 05/23/2022   Vivid dream 05/23/2022   Spider veins of both lower extremities 11/15/2021   Decreased hearing of both ears 03/28/2021   Type 2 diabetes mellitus with diabetic mononeuropathy, without long-term current use of insulin (HCC) 10/25/2020   HLD (hyperlipidemia) 10/25/2020   History of melanoma 10/25/2020   ONSET DATE: 12/2023  REFERRING DIAG: M65.4 (ICD-10-CM) - De Quervain's tenosynovitis, left M25.532 (ICD-10-CM) - Acute wrist pain, left  THERAPY DIAG:  Pain in left wrist  Muscle weakness (generalized)  Other disturbances of skin sensation  Rationale for Evaluation and Treatment: Rehabilitation  SUBJECTIVE:   SUBJECTIVE STATEMENT:  Pt reports she is continuing to do better with BUE pain as she is more conscious of how she is using her hands. Pt got a small thumb spica splint  for her Right hand and it is a reversible and she can use it on the left hand too as it is smaller for some activities.  Pt reports she had the KT taping on since Friday and took it off this morning.  She reported that it felt good, was a little hard to take off but that she didn't need to wear her brace all weekend and had hardly any numbness all weekend.  Pt reports helping her daughter and son in law move some boxes of flooring without use of splint and just taping in place.  She admitted she should have got her splint and knew that was the cause of some of the aching in her thumb today.  Pt accompanied by: self  PERTINENT HISTORY:  February 2025 noted de Quervain's tenosynovitis on the left per MD notes.  S/p injection for de Quervain's 05/20/2024.    Also referred to neurology due to worsening carpal tunnel syndrome on the right side for EMG and nerve conduction studies and anticipates consulting hand surgery.  Other PMH:  DM, hyperlipidemia, hx of Melanoma scalp, hx of vivid dream  PRECAUTIONS: None  RED FLAGS: None   WEIGHT BEARING RESTRICTIONS: No  PAIN: Are you having pain? Yes: NPRS scale: 2/10 Pain location: Left thumb  Pain description: sore; a few tingles in R hand too Aggravating factors: certain movements/use Relieving factors: bracing, better ergonomics, has not taken any meds for >  1 month (since her shot)  FALLS: Has patient fallen in last 6 months? No  LIVING ENVIRONMENT: Lives with: lives with their family and lives alone  PLOF: Independent and pt cares for 1 y.o. grandchild and does cross stitch  PATIENT GOALS:   NEXT MD VISIT: prn  OBJECTIVE:  Note: Objective measures were completed at Evaluation unless otherwise noted.  HAND DOMINANCE: Right  ADLs: Overall ADLs: [facility] for BADLs.  Pt reports difficulty with IADLs including picking up pots/pans, opening items, and caring/lifting 1 y.o. grandchild.     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  TODAY'S TREATMENT:   Orthotic Follow Up  Education provided re: splint application and fit for max fit as well as for consideration of wrist support only for right hand versus spica splint which restricts thumb ROM.  - Therapeutic activities completed for duration as noted below including:  Pt engaged in functional grasp/release and manipulation activities with BUEs without splints in place with education re: wrist and thumb positioning to avoid pain and discomfort and provide considerations for carryover with daily tasks at home.  Self Care: OT education continued with pt re: joint protection, ergonomics, sleep positioning and body mechanic principles as noted in pt's previous instructions as needed to improve UE pain.    OT educated patient on use of paraffin bath.  Patient was informed of risks and benefits to treatment today and in agreement with trial.  Pt tested temperature prior to dipping hand/s and no concerns noted.  Patient tolerated paraffin bath to L hand with a towel wrap for ~10 minutes during education re: AE and jt protection principles.  Paraffin Bath was performed in preparation for trial for home modality consideration.  No redness, irritation or skin integrity concerns were noted before, during and/or after use.   Skin integrity prior to  treatment: Intact. Skin integrity after treatment: Intact.  Information provided about possible home home modality equipment options for paraffin.                    PATIENT EDUCATION: Education details: Joint protection and splint options Person educated: Patient Education method: Explanation, Demonstration, and Verbal cues Education comprehension: verbalized understanding and returned demonstration  HOME EXERCISE PROGRAM: 05/27/24:  basic ROM HEP, positions to avoid and basic activity modifications for holding/lifting objects, incr wear of forearm-based thumb spica splint during the day with lifting, repetitive activities. 06/01/24: Tendon Glides 06/04/24: Median Nerve glides; Putty Activities Access Code: [email or web address] (texted to pt) 06/08/24: Coordination Activities & Joint protection  GOALS: Goals reviewed with patient? Yes  SHORT TERM GOALS: Target date: 06/27/24  Pt will be independent with ROM HEP. Goal status: MET  2.  Pt will verbalize understanding of activity modifications and AE to decr pain. Goal status: MET Holds grocery bags on arms  3.  Pt  will report performing AROM to L wrist/thumb without pain. Baseline:  0-3/10 Goal status: MET   LONG TERM GOALS: Target date: 07/11/24  Pt will be independent with strengthening HEP. Baseline: New to outpt OT Goal status: IN Progress  2.  Pt will improve pain and L hand functional use as shown by improving score on Quick Dash to 10% or less. Baseline: 22.7 Goal status: IN Progress  3.  Pt will improve lateral pinch strength by at least 2lbs with L hand. Baseline:  14lbs Goal status: IN Progress  ASSESSMENT:  CLINICAL IMPRESSION: Patient is a 68 y.o. female who was seen today for occupational therapy treatment for BUE dysfunction ie) DeQuervains and probable carpal tunnel symptoms.  Patient good comfort with trial of kinesio-tape for further pain reduction efforts and would benefit from further education re: home use during  future skilled OT services in the outpatient setting to work on UE pain management and functional use.  Pt has met 3/3 STGs and will benefit from continued skilled OT services in the outpatient setting, probably at decreased frequency, to work on impairments and help pt return to [facility] as able.    PERFORMANCE DEFICITS: in functional skills including ADLs, IADLs, strength, pain, decreased knowledge of precautions, decreased knowledge of use of DME, and UE functional use, and psychosocial skills including habits.   IMPAIRMENTS: are limiting patient from ADLs, IADLs, and leisure.   COMORBIDITIES: may have co-morbidities  that affects occupational performance. Patient will benefit from skilled OT to address above impairments and improve overall function.  REHAB POTENTIAL: Good  PLAN:  OT FREQUENCY: 2x/week  OT DURATION: 6 weeks +eval  PLANNED INTERVENTIONS: 97535 self care/ADL training, 02889 therapeutic exercise, 97530 therapeutic activity, 97112 neuromuscular re-education, 97140 manual therapy, 97035 ultrasound, 97018 paraffin, 02960 fluidotherapy, 97010 moist heat, 97010 cryotherapy, 97760 Orthotic Initial, 97763 Orthotic/Prosthetic subsequent, passive range of motion, patient/family education, and DME and/or AE instructions  RECOMMENDED OTHER SERVICES: none at this time.  CONSULTED AND AGREED WITH PLAN OF CARE: Patient  PLAN FOR NEXT SESSION:  review HEP and update, UBE, Add light weights - with isometric holds Modalities: Ultrasound, Fluido or Parraffin ,  soft tissue mobs,  continue with activity modification - kitchen etc Kinesio taping education for home use  Consider decreased freq to 1x/week  Clarita LITTIE Pride, OTR/L 06/15/2024, 6:55 PM

## 2024-06-17 ENCOUNTER — Ambulatory Visit: Admitting: Occupational Therapy

## 2024-06-17 DIAGNOSIS — R208 Other disturbances of skin sensation: Secondary | ICD-10-CM

## 2024-06-17 DIAGNOSIS — M25532 Pain in left wrist: Secondary | ICD-10-CM | POA: Diagnosis not present

## 2024-06-17 DIAGNOSIS — M6281 Muscle weakness (generalized): Secondary | ICD-10-CM

## 2024-06-17 NOTE — Therapy (Signed)
 OUTPATIENT OCCUPATIONAL THERAPY ORTHO TREATMENT  Patient Name: Amber English MRN: [id number] DOB:[birth year], 68 y.o., female Today's Date: 06/17/2024  PCP: Comer Gaskins, NP REFERRING PROVIDER: Dr. Jacques Copland  END OF SESSION:  OT End of Session - 06/17/24 0930     Visit Number 7    Number of Visits 13    Date for OT Re-Evaluation 07/11/24    Authorization Type Medicare A&B and [employer]--covered 100%    Progress Note Due on Visit 10    OT Start Time 0935    OT Stop Time 1014    OT Time Calculation (min) 39 min    Activity Tolerance Patient tolerated treatment well    Behavior During Therapy WFL for tasks assessed/performed         Past Medical History:  Diagnosis Date   Diabetes mellitus without complication (HCC)    Hyperlipidemia    Melanoma in situ of scalp (HCC)    Past Surgical History:  Procedure Laterality Date   FOOT FRACTURE SURGERY     TOTAL ABDOMINAL HYSTERECTOMY  2022   Patient Active Problem List   Diagnosis Date Noted   Cerumen impaction 12/12/2023   Acute wrist pain, left 12/12/2023   Paresthesias in right hand 06/12/2023   Anxiety and depression 05/23/2022   Vivid dream 05/23/2022   Spider veins of both lower extremities 11/15/2021   Decreased hearing of both ears 03/28/2021   Type 2 diabetes mellitus with diabetic mononeuropathy, without long-term current use of insulin (HCC) 10/25/2020   HLD (hyperlipidemia) 10/25/2020   History of melanoma 10/25/2020   ONSET DATE: 12/2023  REFERRING DIAG: M65.4 (ICD-10-CM) - De Quervain's tenosynovitis, left M25.532 (ICD-10-CM) - Acute wrist pain, left  THERAPY DIAG:  Pain in left wrist  Muscle weakness (generalized)  Other disturbances of skin sensation  Rationale for Evaluation and Treatment: Rehabilitation  SUBJECTIVE:   SUBJECTIVE STATEMENT:  She really liked the tape for both arms and would like to learn how to complete at home.   Pt accompanied by: self  PERTINENT HISTORY:   February 2025 noted de Quervain's tenosynovitis on the left per MD notes.  S/p injection for de Quervain's 05/20/2024.    Also referred to neurology due to worsening carpal tunnel syndrome on the right side for EMG and nerve conduction studies and anticipates consulting hand surgery.  Other PMH:  DM, hyperlipidemia, hx of Melanoma scalp, hx of vivid dream  PRECAUTIONS: None  RED FLAGS: None   WEIGHT BEARING RESTRICTIONS: No  PAIN: Are you having pain? Yes: NPRS scale: 2/10 Pain location: Left thumb  Pain description: sore; a few tingles in R hand too Aggravating factors: certain movements/use Relieving factors: bracing, better ergonomics, has not taken any meds for > 1 month (since her shot)  FALLS: Has patient fallen in last 6 months? No  LIVING ENVIRONMENT: Lives with: lives with their family and lives alone  PLOF: Independent and pt cares for 1 y.o. grandchild and does cross stitch  PATIENT GOALS:   NEXT MD VISIT: prn  OBJECTIVE:  Note: Objective measures were completed at Evaluation unless otherwise noted.  HAND DOMINANCE: Right  ADLs: Overall ADLs: [facility] for BADLs.  Pt reports difficulty with IADLs including picking up pots/pans, opening items, and caring/lifting 1 y.o. grandchild.     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  TODAY'S  TREATMENT:    Self Care: Kinesio tape to B dorsal wrists using space correction technique with 40-60% stretch. Pt demonstrated understanding of tape and wearing schedule and completed application on affected extremities.  OT educated patient on how to obtain Kinesiotape or similar tape for home completion. Discussed reducing frequency to 1xweek.   OT reviewed joint protection, ergonomics, sleep positioning and body mechanic principles as noted in pt instructions as needed to improve UE pain.                    PATIENT EDUCATION: Education details: Primary school teacher; taping Person educated: Patient Education method: Explanation, Demonstration, and Verbal cues Education comprehension: verbalized understanding and returned demonstration  HOME EXERCISE PROGRAM: 05/27/24:  basic ROM HEP, positions to avoid and basic activity modifications for holding/lifting objects, incr wear of forearm-based thumb spica splint during the day with lifting, repetitive activities. 06/01/24: Tendon Glides 06/04/24: Median Nerve glides; Putty Activities Access Code: [email or web address] (texted to pt) 06/08/24: Coordination Activities & Joint protection  GOALS: Goals reviewed with patient? Yes  SHORT TERM GOALS: MET 3/3   LONG TERM GOALS: Target date: 07/11/24  Pt will be independent with strengthening HEP. Baseline: New to outpt OT Goal status: IN Progress  2.  Pt will improve pain and L hand functional use as shown by improving score on Quick Dash to 10% or less. Baseline: 22.7 Goal status: IN Progress  3.  Pt will improve lateral pinch strength by at least 2lbs with L hand. Baseline:  14lbs Goal status: IN Progress  ASSESSMENT:  CLINICAL IMPRESSION: Patient demonstrates good progression towards goals this session. Improvement to symptoms with use of taping and pt is able to return demonstration of technique as needed for home completion.   PERFORMANCE DEFICITS: in functional skills including ADLs, IADLs,  strength, pain, decreased knowledge of precautions, decreased knowledge of use of DME, and UE functional use, and psychosocial skills including habits.   IMPAIRMENTS: are limiting patient from ADLs, IADLs, and leisure.   COMORBIDITIES: may have co-morbidities  that affects occupational performance. Patient will benefit from skilled OT to address above impairments and improve overall function.  REHAB POTENTIAL: Good  PLAN:  OT FREQUENCY: 2x/week  OT DURATION: 6 weeks +eval  PLANNED INTERVENTIONS: 97535 self care/ADL training, 02889 therapeutic exercise, 97530 therapeutic activity, 97112 neuromuscular re-education, 97140 manual therapy, 97035 ultrasound, 97018 paraffin, 02960 fluidotherapy, 97010 moist heat, 97010 cryotherapy, 97760 Orthotic Initial, 97763 Orthotic/Prosthetic subsequent, passive range of motion, patient/family education, and DME and/or AE instructions  RECOMMENDED OTHER SERVICES: none at this time.  CONSULTED AND AGREED WITH PLAN OF CARE: Patient  PLAN FOR NEXT SESSION:  review HEP and update, UBE, Add light weights - with isometric holds Modalities: Ultrasound, Fluido or Parraffin ,  soft tissue mobs,  continue with activity modification - kitchen etc Kinesio taping education for home use  Consider decreased freq to 1x/week - seems appropriate pt to discuss Mon.  Jocelyn CHRISTELLA Bottom, OTR/L 06/17/2024, 1:48 PM

## 2024-06-22 ENCOUNTER — Ambulatory Visit: Admitting: Occupational Therapy

## 2024-06-22 DIAGNOSIS — M6281 Muscle weakness (generalized): Secondary | ICD-10-CM

## 2024-06-22 DIAGNOSIS — R208 Other disturbances of skin sensation: Secondary | ICD-10-CM

## 2024-06-22 DIAGNOSIS — M25532 Pain in left wrist: Secondary | ICD-10-CM | POA: Diagnosis not present

## 2024-06-22 DIAGNOSIS — R278 Other lack of coordination: Secondary | ICD-10-CM

## 2024-06-22 NOTE — Therapy (Signed)
 OUTPATIENT OCCUPATIONAL THERAPY ORTHO TREATMENT  Patient Name: Amber English MRN: [id number] DOB:Mar 12, 1956, 68 y.o., female Today's Date: 06/22/2024  PCP: Comer Gaskins, NP REFERRING PROVIDER: Dr. Jacques Copland  END OF SESSION:  OT End of Session - 06/22/24 0844     Visit Number 8    Number of Visits 13    Date for OT Re-Evaluation 07/11/24    Authorization Type Medicare A&B and [employer]--covered 100%    Progress Note Due on Visit 10    OT Start Time 0845    OT Stop Time 0930    OT Time Calculation (min) 45 min    Equipment Utilized During Treatment FM game    Activity Tolerance Patient tolerated treatment well    Behavior During Therapy WFL for tasks assessed/performed         Past Medical History:  Diagnosis Date   Diabetes mellitus without complication (HCC)    Hyperlipidemia    Melanoma in situ of scalp (HCC)    Past Surgical History:  Procedure Laterality Date   FOOT FRACTURE SURGERY     TOTAL ABDOMINAL HYSTERECTOMY  2022   Patient Active Problem List   Diagnosis Date Noted   Cerumen impaction 12/12/2023   Acute wrist pain, left 12/12/2023   Paresthesias in right hand 06/12/2023   Anxiety and depression 05/23/2022   Vivid dream 05/23/2022   Spider veins of both lower extremities 11/15/2021   Decreased hearing of both ears 03/28/2021   Type 2 diabetes mellitus with diabetic mononeuropathy, without long-term current use of insulin (HCC) 10/25/2020   HLD (hyperlipidemia) 10/25/2020   History of melanoma 10/25/2020   ONSET DATE: 12/2023  REFERRING DIAG: M65.4 (ICD-10-CM) - De Quervain's tenosynovitis, left M25.532 (ICD-10-CM) - Acute wrist pain, left  THERAPY DIAG:  Other lack of coordination  Pain in left wrist  Muscle weakness (generalized)  Other disturbances of skin sensation  Rationale for Evaluation and Treatment: Rehabilitation  SUBJECTIVE:   SUBJECTIVE STATEMENT:  Pt reports she wore the KT tape Wed to Sunday and it really seems to  help - especially with carpal tunnel symptoms. She reports only tiny tingles this morning but almost doesn't notice them.  Pt is carrying her grandchild around more but is being more aware of holding L hand more straight and not straining thumb.  Pt reports her L hand is a little sore from doing stuff again this weekend without her brace on but not really pain just tightness/pulling sensation.  Pt accompanied by: self  PERTINENT HISTORY:  February 2025 noted de Quervain's tenosynovitis on the left per MD notes.  S/p injection for de Quervain's 05/20/2024.    Also referred to neurology due to worsening carpal tunnel syndrome on the right side for EMG and nerve conduction studies and anticipates consulting hand surgery.  Other PMH:  DM, hyperlipidemia, hx of Melanoma scalp, hx of vivid dream  PRECAUTIONS: None  RED FLAGS: None   WEIGHT BEARING RESTRICTIONS: No  PAIN:  Little sore with L hand this weekend without brace on  Are you having pain? Yes: NPRS scale: no pain just tightness/pulling sensation/10 Pain location: Left thumb  Pain description: sore; a few tingles in R hand too Aggravating factors: certain movements/use Relieving factors: bracing, better ergonomics, has not taken any meds for > 1 month (since her shot)  FALLS: Has patient fallen in last 6 months? No  LIVING ENVIRONMENT: Lives with: lives with their family and lives alone  PLOF: Independent and pt cares for 1 y.o. grandchild and  does cross stitch  PATIENT GOALS:   NEXT MD VISIT: prn  OBJECTIVE:  Note: Objective measures were completed at Evaluation unless otherwise noted.  HAND DOMINANCE: Right  ADLs: Overall ADLs: [facility] for BADLs.  Pt reports difficulty with IADLs including picking up pots/pans, opening items, and caring/lifting 1 y.o. grandchild.     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  TODAY'S TREATMENT:   - Self Care education and training completed for duration as noted below including: Pt reported her L hand is a little sore from doing stuff again this weekend without her brace - therefore education provided on placement of signs/notes and splint in areas she will recall use to continue to decreased discomfort.  Continued with jt protection and pain management with trial again of paraffin due to good comfort noted previously.    - Therapeutic activities completed for duration as noted below including:  Pt participated in Chain game using BUE for eye-hand coordination, pinch strength, visual perceptual skills and cognition for planning and problem solving.  Game required pt to stretch elastics over 4 pegs to create triangles to 'capture' spaces by placing a game piece inside the completed triangle/s. Pt able to problem solve to create triangles multiple time with min cues to begin and no physical difficulty with coordination of L UE to stretch elastics.  Minimal issue with sensory awareness of lightweight game pieces. Pt was able to perform in hand manipulation to manage game pieces with extra time and followed directions with verbal and visual to introduce new game/rules. Pt able to play game during social conversation without difficulty.                   PATIENT EDUCATION: Education details: Functional coordination and strengthening task/fame Person educated:  Patient Education method: Explanation, Demonstration, and Verbal cues Education comprehension: verbalized understanding and returned demonstration  HOME EXERCISE PROGRAM: 05/27/24:  basic ROM HEP, positions to avoid and basic activity modifications for holding/lifting objects, incr wear of forearm-based thumb spica splint during the day with lifting, repetitive activities. 06/01/24: Tendon Glides 06/04/24: Median Nerve glides; Putty Activities Access Code: [email or web address] (texted to pt) 06/08/24: Coordination Activities & Joint protection  GOALS: Goals reviewed with patient? Yes  SHORT TERM GOALS: MET 3/3   LONG TERM GOALS: Target date: 07/11/24  Pt will be independent with strengthening HEP. Baseline: New to outpt OT Goal status: IN Progress  2.  Pt will improve pain and L hand functional use as shown by improving score on Quick Dash to 10% or less. Baseline: 22.7 Goal status: IN Progress  3.  Pt will improve lateral pinch strength by at least 2lbs with L hand. Baseline:  14lbs Goal status: IN Progress  4. NEW Pt will be/ independent with managing pain via appropriate modalities, splints and even KT tape./  Baseline: 06/22/24  Trials of all options with need to learn KT taping techniques for self management. Goal Status: IN Progress    ASSESSMENT:  CLINICAL IMPRESSION: Patient demonstrates good progression towards OT goals. Frequency to decreased to 1x/week after this week.  Improvement of symptoms with use of KT taping continues to be noted, therefore further education to be completed for pt to return demonstration of technique as needed for home completion.   PERFORMANCE DEFICITS: in functional skills including ADLs, IADLs, strength, pain, decreased knowledge of precautions, decreased knowledge of use of DME, and UE functional use, and psychosocial skills including habits.   IMPAIRMENTS: are limiting patient from ADLs, IADLs, and leisure.   COMORBIDITIES: may have co-morbidities   that affects occupational performance. Patient will benefit from skilled OT to address above impairments and improve overall function.  REHAB POTENTIAL: Good  PLAN:  OT FREQUENCY: 2x/week  OT DURATION: 6 weeks +eval  PLANNED INTERVENTIONS: 97535 self care/ADL training, 02889 therapeutic exercise, 97530 therapeutic activity, 97112 neuromuscular re-education, 97140 manual therapy, 97035 ultrasound, 97018 paraffin, 02960 fluidotherapy, 97010 moist heat, 97010 cryotherapy, 97760 Orthotic Initial, 97763 Orthotic/Prosthetic subsequent, passive range of motion, patient/family education, and DME and/or AE instructions  RECOMMENDED OTHER SERVICES: none at this time.  CONSULTED AND AGREED WITH PLAN OF CARE: Patient  PLAN FOR NEXT SESSION:  Kinesio taping education for home use review HEP and update, UBE, Add light weights - with isometric holds Modalities: Ultrasound, Fluido or Parraffin ,  soft tissue mobs,  continue with activity modification - kitchen etc  freq decreased to 1x/week next week  Clarita LITTIE Pride, OTR/L 06/22/2024, 6:26 PM

## 2024-06-24 ENCOUNTER — Ambulatory Visit: Admitting: Occupational Therapy

## 2024-06-24 DIAGNOSIS — M25532 Pain in left wrist: Secondary | ICD-10-CM | POA: Diagnosis not present

## 2024-06-24 DIAGNOSIS — R208 Other disturbances of skin sensation: Secondary | ICD-10-CM

## 2024-06-24 DIAGNOSIS — M6281 Muscle weakness (generalized): Secondary | ICD-10-CM

## 2024-06-24 DIAGNOSIS — R278 Other lack of coordination: Secondary | ICD-10-CM

## 2024-06-24 NOTE — Patient Instructions (Addendum)
  You can place your arms out to the sides with pillows underneath or place a pillow across your stomach with your hands resting on top for support.   Hold a pillow with your arms away from your body.  Do not bend your arms at the elbows or tuck your hands under your head. Do not place your hand on top or underneath pillow. Use outside of pillow as a border.    [email or web address]  Access Code: [email or web address]

## 2024-06-24 NOTE — Therapy (Signed)
 OUTPATIENT OCCUPATIONAL THERAPY ORTHO TREATMENT  Patient Name: Amber English MRN: [id number] DOB:1955-12-01, 68 y.o., female Today's Date: 06/24/2024  PCP: Comer Gaskins, NP REFERRING PROVIDER: Dr. Jacques Copland  END OF SESSION:  OT End of Session - 06/24/24 0935     Visit Number 9    Number of Visits 13    Date for OT Re-Evaluation 07/11/24    Authorization Type Medicare A&B and [employer]--covered 100%    Progress Note Due on Visit 10    OT Start Time 0935    OT Stop Time 1013    OT Time Calculation (min) 38 min    Activity Tolerance Patient tolerated treatment well    Behavior During Therapy WFL for tasks assessed/performed         Past Medical History:  Diagnosis Date   Diabetes mellitus without complication (HCC)    Hyperlipidemia    Melanoma in situ of scalp (HCC)    Past Surgical History:  Procedure Laterality Date   FOOT FRACTURE SURGERY     TOTAL ABDOMINAL HYSTERECTOMY  2022   Patient Active Problem List   Diagnosis Date Noted   Cerumen impaction 12/12/2023   Acute wrist pain, left 12/12/2023   Paresthesias in right hand 06/12/2023   Anxiety and depression 05/23/2022   Vivid dream 05/23/2022   Spider veins of both lower extremities 11/15/2021   Decreased hearing of both ears 03/28/2021   Type 2 diabetes mellitus with diabetic mononeuropathy, without long-term current use of insulin (HCC) 10/25/2020   HLD (hyperlipidemia) 10/25/2020   History of melanoma 10/25/2020   ONSET DATE: 12/2023  REFERRING DIAG: M65.4 (ICD-10-CM) - De Quervain's tenosynovitis, left M25.532 (ICD-10-CM) - Acute wrist pain, left  THERAPY DIAG:  Other lack of coordination  Pain in left wrist  Muscle weakness (generalized)  Other disturbances of skin sensation  Rationale for Evaluation and Treatment: Rehabilitation  SUBJECTIVE:   SUBJECTIVE STATEMENT:  Pt reports she has ordered KT tape but would like to work on application from start to finish.  Pt accompanied by:  self  PERTINENT HISTORY:  February 2025 noted de Quervain's tenosynovitis on the left per MD notes.  S/p injection for de Quervain's 05/20/2024.    Also referred to neurology due to worsening carpal tunnel syndrome on the right side for EMG and nerve conduction studies and anticipates consulting hand surgery.  Other PMH:  DM, hyperlipidemia, hx of Melanoma scalp, hx of vivid dream  PRECAUTIONS: None  RED FLAGS: None   WEIGHT BEARING RESTRICTIONS: No  PAIN:  Little sore with L hand this weekend without brace on  Are you having pain? Yes: NPRS scale: no pain just tightness/pulling sensation/10 Pain location: Left thumb  Pain description: sore; a few tingles in R hand too Aggravating factors: certain movements/use Relieving factors: bracing, better ergonomics, has not taken any meds for > 1 month (since her shot)  FALLS: Has patient fallen in last 6 months? No  LIVING ENVIRONMENT: Lives with: lives with their family and lives alone  PLOF: Independent and pt cares for 1 y.o. grandchild and does cross stitch  PATIENT GOALS:   NEXT MD VISIT: prn  OBJECTIVE:  Note: Objective measures were completed at Evaluation unless otherwise noted.  HAND DOMINANCE: Right  ADLs: Overall ADLs: [facility] for BADLs.  Pt reports difficulty with IADLs including picking up pots/pans, opening items, and caring/lifting 1 y.o. grandchild.     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  TODAY'S TREATMENT:   - Self Care education and training completed for duration as noted below including: Objective measures assessed as noted in Goals section to determine progression towards goals. OT supervised B wrist taping using space correction technique for symptom management. Required min cues for proper completion. Process videoed on pt's phone to promote carryover.   - Therapeutic exercises completed for duration as noted below including:  - Seated Isometric Wrist Radial Deviation with Manual Resistance  - 1-2 x daily - 10 reps  - Seated Eccentric Wrist Extension  - 1-2 x daily - 10 reps - Seated Eccentric Wrist Flexion with Dumbbell (Mirrored)  - 1-2 x daily - 10 reps                  PATIENT EDUCATION: Education details: taping; goal progression; HEP Person educated: Patient Education method: Explanation, Demonstration, and Verbal cues Education comprehension: verbalized understanding and returned demonstration  HOME EXERCISE PROGRAM: 05/27/24:  basic ROM HEP, positions to avoid and basic activity modifications for holding/lifting objects, incr wear of forearm-based thumb spica splint during the day with lifting, repetitive activities. 06/01/24: Tendon Glides 06/04/24: Median Nerve glides; Putty Activities Access Code: [email or web address] (texted to pt) 06/08/24: Coordination Activities & Joint protection 06/24/2024: sleep positioning; updated Access Code: [email or web address] with isometrics and eccentrics  GOALS: Goals reviewed with patient? Yes  SHORT TERM GOALS: MET 3/3   LONG TERM GOALS: Target date: 07/11/24  Pt will be independent with strengthening HEP. Baseline: New to outpt OT Goal status: IN Progress  2.  Pt will improve pain and L hand functional use as shown by improving score on Quick Dash to 10% or less. Baseline: 22.7 Goal status: IN Progress  3.  Pt will improve lateral  pinch strength by at least 2lbs with L hand. Baseline:  14lbs 06/24/2024: 18.5 lbs Goal status: MET  4. NEW Pt will be/ independent with managing pain via appropriate modalities, splints and even KT tape./  Baseline: 06/22/24 Trials of all options with need to learn KT taping techniques for self management. Goal Status: IN Progress    ASSESSMENT:  CLINICAL IMPRESSION:  Patient demonstrates good understanding of B wrist taping as needed for home completion. Will monitor tolerance to light strengthening initiated this visit.   PERFORMANCE DEFICITS: in functional skills including ADLs, IADLs, strength, pain, decreased knowledge of precautions, decreased knowledge of use of DME, and UE functional use, and psychosocial skills including habits.   IMPAIRMENTS: are limiting patient from ADLs, IADLs, and leisure.   COMORBIDITIES: may have co-morbidities  that affects occupational performance. Patient will benefit from skilled OT to address above impairments and improve overall function.  REHAB POTENTIAL: Good  PLAN:  OT FREQUENCY: 2x/week  OT DURATION: 6 weeks +eval  PLANNED INTERVENTIONS: 97535 self care/ADL training, 02889 therapeutic exercise, 97530 therapeutic activity, 97112 neuromuscular re-education, 97140 manual therapy, 97035 ultrasound, 97018 paraffin, 02960 fluidotherapy, 97010  moist heat, 97010 cryotherapy, 97760 Orthotic Initial, 02236 Orthotic/Prosthetic subsequent, passive range of motion, patient/family education, and DME and/or AE instructions  RECOMMENDED OTHER SERVICES: none at this time.  CONSULTED AND AGREED WITH PLAN OF CARE: Patient  PLAN FOR NEXT SESSION:  review HEP and update PRN, UBE Modalities: Ultrasound, Fluido or Parraffin ,  soft tissue mobs,  continue with activity modification - kitchen etc  freq decreased to 1x/week  [employer], OTR/L 06/24/2024, 9:35 AM

## 2024-06-29 ENCOUNTER — Ambulatory Visit: Admitting: Occupational Therapy

## 2024-06-29 DIAGNOSIS — M6281 Muscle weakness (generalized): Secondary | ICD-10-CM

## 2024-06-29 DIAGNOSIS — M25532 Pain in left wrist: Secondary | ICD-10-CM | POA: Diagnosis not present

## 2024-06-29 DIAGNOSIS — R278 Other lack of coordination: Secondary | ICD-10-CM

## 2024-06-29 DIAGNOSIS — R208 Other disturbances of skin sensation: Secondary | ICD-10-CM

## 2024-06-29 NOTE — Therapy (Signed)
 OUTPATIENT OCCUPATIONAL THERAPY ORTHO TREATMENT & PROGRESS NOTE  Patient Name: Amber English MRN: [id number] DOB:07-30-1956, 68 y.o., female Today's Date: 06/29/2024  PCP: Comer Gaskins, NP REFERRING PROVIDER: Dr. Jacques Copland  END OF SESSION:  OT End of Session - 06/29/24 0847     Visit Number 10    Number of Visits 13    Date for OT Re-Evaluation 07/11/24    Authorization Type Medicare A[employer]B and [employer]--covered 100%    Progress Note Due on Visit 10    OT Start Time 0846    OT Stop Time 0930    OT Time Calculation (min) 44 min    Equipment Utilized During Treatment FM game    Activity Tolerance Patient tolerated treatment well    Behavior During Therapy WFL for tasks assessed/performed         Past Medical History:  Diagnosis Date   Diabetes mellitus without complication (HCC)    Hyperlipidemia    Melanoma in situ of scalp (HCC)    Past Surgical History:  Procedure Laterality Date   FOOT FRACTURE SURGERY     TOTAL ABDOMINAL HYSTERECTOMY  2022   Patient Active Problem List   Diagnosis Date Noted   Cerumen impaction 12/12/2023   Acute wrist pain, left 12/12/2023   Paresthesias in right hand 06/12/2023   Anxiety and depression 05/23/2022   Vivid dream 05/23/2022   Spider veins of both lower extremities 11/15/2021   Decreased hearing of both ears 03/28/2021   Type 2 diabetes mellitus with diabetic mononeuropathy, without long-term current use of insulin (HCC) 10/25/2020   HLD (hyperlipidemia) 10/25/2020   History of melanoma 10/25/2020   ONSET DATE: 12/2023  REFERRING DIAG: M65.4 (ICD-10-CM) - De Quervain's tenosynovitis, left M25.532 (ICD-10-CM) - Acute wrist pain, left  THERAPY DIAG:  Other lack of coordination  Pain in left wrist  Muscle weakness (generalized)  Other disturbances of skin sensation  Rationale for Evaluation and Treatment: Rehabilitation  SUBJECTIVE:   SUBJECTIVE STATEMENT:  Pt reports she has been doing well and just took  her KT tape of in the shower today.  She has her EMG on Friday and is encouraged to leave the tape off until after her appt but to have it on for her OT appt next week so we can determine appropriate carryover.   Pt accompanied by: self  PERTINENT HISTORY:  February 2025 noted de Quervain's tenosynovitis on the left per MD notes.  S/p injection for de Quervain's 05/20/2024.    Also referred to neurology due to worsening carpal tunnel syndrome on the right side for EMG and nerve conduction studies and anticipates consulting hand surgery.  Other PMH:  DM, hyperlipidemia, hx of Melanoma scalp, hx of vivid dream  PRECAUTIONS: None  RED FLAGS: None   WEIGHT BEARING RESTRICTIONS: No  PAIN:   Are you having pain? No  FALLS: Has patient fallen in last 6 months? No  LIVING ENVIRONMENT: Lives with: lives with their family and lives alone  PLOF: Independent and pt cares for 1 y.o. grandchild and does cross stitch  PATIENT GOALS:   NEXT MD VISIT: prn  OBJECTIVE:  Note: Objective measures were completed at Evaluation unless otherwise noted.  HAND DOMINANCE: Right  ADLs: Overall ADLs: [facility] for BADLs.  Pt reports difficulty with IADLs including picking up pots/pans, opening items, and caring/lifting 1 y.o. grandchild.     FUNCTIONAL OUTCOME MEASURES: Quick Dash: 22.7  UPPER EXTREMITY ROM:   AROM L wrist UD/RD, wrist flex/ext WNL with pain noted  with UD and wrist flex, gross finger flex/ext and thumb ROM WNL  UPPER EXTREMITY MMT:   NT  HAND FUNCTION: Grip strength: Right: 56.8 lbs; Left: 51.8 lbs, Lateral pinch: Right: 19 lbs, Left: 14 lbs, 3 point pinch: Right: 15 lbs, Left: 13 lbs, and Tip pinch: Right 14 lbs, Left: 16 lbs  06/29/24: Grip Right: 63.2, 58.6, 65.4 lbs Avg 62.4 lbs Left: 71.2, 65.2, 67.9 lbs Avg 68.1 lbs Tip Pinch: Right: 10 Left: 9.5 lbs Lateral: Right: 20 Left: 17.5 lbs 3 Point: Right: 14 Left: 18 lbs  COORDINATION: WNL per pt report  SENSATION: Pt with  paraesthesias in R hand due to CTS  EDEMA: none noted  COGNITION: Overall cognitive status: Within functional limits for tasks assessed  OBSERVATIONS: Pt is point tender at base of thumb/radial wrist.  TODAY'S TREATMENT:   - Self Care education and training completed for duration as noted below including:  Therapist reviewed goals with patient and updated patient progression.  No additional functional limitations identified.  Plan is to DC pt next visit s/p EMG. Retested grip strength: Right: 63.2, 58.6, 65.4 lbs Avg 62.4 lbs / Left: 71.2, 65.2, 67.9 lbs Avg 68.1 lbs Tip Pinch: Right: 10 Left: 9.5 lbs / Lateral: Right: 20 Left: 17.5 lbs / 3 Point: Right: 14 Left: 18 lbs Also redid the QuickDash with great results of 4.5 points verus 22.6 pts at eval.  - Therapeutic activities completed for duration as noted below including:  Patient engaged in a fine motor activity using [place] to promote dexterity, repetitive finger movements, and in-hand manipulation skills. The activity was chosen to facilitate controlled, precise thumb and finger motions while minimizing strain on the wrist and forearm.  Patient demonstrated good ability to grasp, release, and manipulate small game pieces including holding 5+ at a time in her hand.  Repetitive movements involved in the game helped encourage tendon gliding and reduce stiffness associated with DeQuervain's and carpal tunnel symptoms.  In-hand manipulation skills such as finger-to-palm translation and isolated thumb opposition were practiced during gameplay.  Patient tolerated the activity well with no discomfort and verbalized increased confidence in hand function.  Therapist provided min verbal cues and ergonomic recommendations to ensure proper wrist positioning and avoid aggravation of symptoms.                  PATIENT EDUCATION: Education details: goal progression Person educated: Patient Education method: Explanation, Demonstration, and Verbal  cues Education comprehension: verbalized understanding and returned demonstration  HOME EXERCISE PROGRAM: 05/27/24:  basic ROM HEP, positions to avoid and basic activity modifications for holding/lifting objects, incr wear of forearm-based thumb spica splint during the day with lifting, repetitive activities. 06/01/24: Tendon Glides 06/04/24: Median Nerve glides; Putty Activities Access Code: [email or web address] (texted to pt) 06/08/24: Coordination Activities & Joint protection 06/24/2024: sleep positioning; updated Access Code: [email or web address] with isometrics and eccentrics  GOALS: Goals reviewed with patient? Yes  SHORT TERM GOALS: MET 3/3   LONG TERM GOALS: Target date: 07/11/24  Pt will be independent with strengthening HEP. Baseline: New to outpt OT Goal status: IN Progress  2.  Pt will improve pain and L hand functional use as shown by improving score on Quick Dash to 10% or less. Baseline: 22.7 Goal status: MET 06/29/24: 4.5   3.  Pt will improve lateral pinch strength by at least 2lbs with L hand. Baseline:  14lbs 06/24/2024: 18.5 lbs Goal status: MET  4. NEW Pt will be/ independent with managing pain via appropriate modalities, splints  and even KT tape.  Baseline: 06/22/24 Trials of all options with need to learn KT taping techniques for self management. Goal Status: IN Progress    ASSESSMENT:  CLINICAL IMPRESSION:  Patient demonstrates improvement in BUE symptoms vis use of splints, joint protection, KT taping and HEP ideas.  Pt has EMG this week and had no issues with HEP options introduced recently therefore anticipate discharge at next appt. Will monitor appropriate application of KT tape at home this weekend and ongoing tolerance to light strengthening.    PERFORMANCE DEFICITS: in functional skills including ADLs, IADLs, strength, pain, decreased knowledge of precautions, decreased knowledge of use of DME, and UE functional use, and psychosocial skills including habits.   IMPAIRMENTS:  are limiting patient from ADLs, IADLs, and leisure.   COMORBIDITIES: may have co-morbidities  that affects occupational performance. Patient will benefit from skilled OT to address above impairments and improve overall function.  REHAB POTENTIAL: Good  PLAN:  OT FREQUENCY: 2x/week  OT DURATION: 6 weeks +eval  PLANNED INTERVENTIONS: 97535 self care/ADL training, 02889 therapeutic exercise, 97530 therapeutic activity, 97112 neuromuscular re-education, 97140 manual therapy, 97035 ultrasound, 97018 paraffin, 02960 fluidotherapy, 97010 moist heat, 97010 cryotherapy, 97760 Orthotic Initial, 97763 Orthotic/Prosthetic subsequent, passive range of motion, patient/family education, and DME and/or AE instructions  RECOMMENDED OTHER SERVICES: none at this time.  CONSULTED AND AGREED WITH PLAN OF CARE: Patient  PLAN FOR NEXT SESSION:  DC visit Check KT tape application review HEP and update   Clarita LITTIE Pride, OTR/L 06/29/2024, 5:51 PM

## 2024-07-01 ENCOUNTER — Encounter: Admitting: Occupational Therapy

## 2024-07-03 ENCOUNTER — Ambulatory Visit (INDEPENDENT_AMBULATORY_CARE_PROVIDER_SITE_OTHER): Admitting: Neurology

## 2024-07-03 DIAGNOSIS — G5601 Carpal tunnel syndrome, right upper limb: Secondary | ICD-10-CM

## 2024-07-03 DIAGNOSIS — R202 Paresthesia of skin: Secondary | ICD-10-CM | POA: Diagnosis not present

## 2024-07-03 NOTE — Procedures (Signed)
  [facility]  [address] [place], [address]  [place], [place] [address] Tel: [phone] Fax: [phone] Test Date:  07/03/2024  Patient: Amber English DOB: 06/24/1956 Physician: Tonita Blanch, DO  Sex: Female Height: 5' 7 Ref Phys: Jacques Schroeder, MD  ID#: [id number]   Technician:    History:   NCV & EMG Findings: Extensive electrodiagnostic testing of the right upper extremity shows:  Right median sensory response shows prolonged latency (4.4 ms).  Right ulnar sensory response is within normal limits. Right median motor response shows prolonged latency (4.2 ms).  Right ulnar motor response is within normal limits.  Chronic motor axon loss changes are seen affecting the right abductor pollicis brevis muscle, without accompanying active denervation.   Impression: Right median neuropathy at or distal to the wrist, consistent with a clinical diagnosis of carpal tunnel syndrome.  Overall, these findings are moderate in degree electrically.   ___________________________ Tonita Blanch, DO    Nerve Conduction Studies   Stim Site NR Peak (ms) Norm Peak (ms) O-P Amp (V) Norm O-P Amp  Right Median Anti Sensory (2nd Digit)  32 C  Wrist    *4.4 <3.8 16.1 >10  Right Ulnar Anti Sensory (5th Digit)  32 C  Wrist    2.8 <3.2 27.4 >5     Stim Site NR Onset (ms) Norm Onset (ms) O-P Amp (mV) Norm O-P Amp Site1 Site2 Delta-0 (ms) Dist (cm) Vel (m/s) Norm Vel (m/s)  Right Median Motor (Abd Poll Brev)  32 C  Wrist    *4.2 <4.0 11.6 >5 Elbow Wrist 5.3 29.0 55 >50  Elbow    9.5  10.7         Right Ulnar Motor (Abd Dig Minimi)  32 C  Wrist    2.3 <3.1 10.0 >7 B Elbow Wrist 3.6 22.0 61 >50  B Elbow    5.9  9.7  A Elbow B Elbow 1.4 10.0 71 >50  A Elbow    7.3  9.4          Electromyography   Side Muscle Ins.Act Fibs Fasc Recrt Amp Dur Poly Activation Comment  Right 1stDorInt Nml Nml Nml Nml Nml Nml Nml Nml N/A  Right Abd Poll Brev Nml Nml Nml *1- *1+ *1+ *1+ Nml N/A   Right PronatorTeres Nml Nml Nml Nml Nml Nml Nml Nml N/A  Right Biceps Nml Nml Nml Nml Nml Nml Nml Nml N/A  Right Triceps Nml Nml Nml Nml Nml Nml Nml Nml N/A  Right Deltoid Nml Nml Nml Nml Nml Nml Nml Nml N/A      Waveforms:

## 2024-07-04 ENCOUNTER — Ambulatory Visit: Payer: Self-pay | Admitting: Family Medicine

## 2024-07-06 ENCOUNTER — Encounter: Admitting: Occupational Therapy

## 2024-07-07 NOTE — Therapy (Unsigned)
 OUTPATIENT OCCUPATIONAL THERAPY ORTHO TREATMENT & PROGRESS NOTE  Patient Name: Amber English MRN: [id number] DOB:18-Apr-1956, 68 y.o., female Today's Date: 07/07/2024  PCP: Comer Gaskins, NP REFERRING PROVIDER: Dr. Jacques Copland  END OF SESSION:   Past Medical History:  Diagnosis Date   Diabetes mellitus without complication (HCC)    Hyperlipidemia    Melanoma in situ of scalp [facility])    Past Surgical History:  Procedure Laterality Date   FOOT FRACTURE SURGERY     TOTAL ABDOMINAL HYSTERECTOMY  2022   Patient Active Problem List   Diagnosis Date Noted   Cerumen impaction 12/12/2023   Acute wrist pain, left 12/12/2023   Paresthesias in right hand 06/12/2023   Anxiety and depression 05/23/2022   Vivid dream 05/23/2022   Spider veins of both lower extremities 11/15/2021   Decreased hearing of both ears 03/28/2021   Type 2 diabetes mellitus with diabetic mononeuropathy, without long-term current use of insulin (HCC) 10/25/2020   HLD (hyperlipidemia) 10/25/2020   History of melanoma 10/25/2020   ONSET DATE: 12/2023  REFERRING DIAG: M65.4 (ICD-10-CM) - De Quervain's tenosynovitis, left M25.532 (ICD-10-CM) - Acute wrist pain, left  THERAPY DIAG:  No diagnosis found.  Rationale for Evaluation and Treatment: Rehabilitation  SUBJECTIVE:   SUBJECTIVE STATEMENT:  Pt reports she has been doing well and just took her KT tape of in the shower today.  She has her EMG on Friday and is encouraged to leave the tape off until after her appt but to have it on for her OT appt next week so we can determine appropriate carryover.   Pt accompanied by: self  PERTINENT HISTORY:  February 2025 noted de Quervain's tenosynovitis on the left per MD notes.  S/p injection for de Quervain's 05/20/2024.    Also referred to neurology due to worsening carpal tunnel syndrome on the right side for EMG and nerve conduction studies and anticipates consulting hand surgery.  Other PMH:  DM,  hyperlipidemia, hx of Melanoma scalp, hx of vivid dream  PRECAUTIONS: None  RED FLAGS: None   WEIGHT BEARING RESTRICTIONS: No  PAIN:   Are you having pain? No  FALLS: Has patient fallen in last 6 months? No  LIVING ENVIRONMENT: Lives with: lives with their family and lives alone  PLOF: Independent and pt cares for 1 y.o. grandchild and does cross stitch  PATIENT GOALS:   NEXT MD VISIT: prn  OBJECTIVE:  Note: Objective measures were completed at Evaluation unless otherwise noted.  HAND DOMINANCE: Right  ADLs: Overall ADLs: [facility] for BADLs.  Pt reports difficulty with IADLs including picking up pots/pans, opening items, and caring/lifting 1 y.o. grandchild.     FUNCTIONAL OUTCOME MEASURES: Quick Dash: 22.7  UPPER EXTREMITY ROM:   AROM L wrist UD/RD, wrist flex/ext WNL with pain noted with UD and wrist flex, gross finger flex/ext and thumb ROM WNL  UPPER EXTREMITY MMT:   NT  HAND FUNCTION: Grip strength: Right: 56.8 lbs; Left: 51.8 lbs, Lateral pinch: Right: 19 lbs, Left: 14 lbs, 3 point pinch: Right: 15 lbs, Left: 13 lbs, and Tip pinch: Right 14 lbs, Left: 16 lbs  06/29/24: Grip Right: 63.2, 58.6, 65.4 lbs Avg 62.4 lbs Left: 71.2, 65.2, 67.9 lbs Avg 68.1 lbs Tip Pinch: Right: 10 Left: 9.5 lbs Lateral: Right: 20 Left: 17.5 lbs 3 Point: Right: 14 Left: 18 lbs  COORDINATION: WNL per pt report  SENSATION: Pt with paraesthesias in R hand due to CTS  EDEMA: none noted  COGNITION: Overall cognitive status: Within  functional limits for tasks assessed  OBSERVATIONS: Pt is point tender at base of thumb/radial wrist.  TODAY'S TREATMENT:   - Self Care education and training completed for duration as noted below including:  Therapist reviewed goals with patient and updated patient progression.  No additional functional limitations identified.  Plan is to DC pt next visit s/p EMG. Retested grip strength: Right: 63.2, 58.6, 65.4 lbs Avg 62.4 lbs / Left: 71.2, 65.2, 67.9  lbs Avg 68.1 lbs Tip Pinch: Right: 10 Left: 9.5 lbs / Lateral: Right: 20 Left: 17.5 lbs / 3 Point: Right: 14 Left: 18 lbs Also redid the QuickDash with great results of 4.5 points verus 22.6 pts at eval.  - Therapeutic activities completed for duration as noted below including:  Patient engaged in a fine motor activity using [place] to promote dexterity, repetitive finger movements, and in-hand manipulation skills. The activity was chosen to facilitate controlled, precise thumb and finger motions while minimizing strain on the wrist and forearm.  Patient demonstrated good ability to grasp, release, and manipulate small game pieces including holding 5+ at a time in her hand.  Repetitive movements involved in the game helped encourage tendon gliding and reduce stiffness associated with DeQuervain's and carpal tunnel symptoms.  In-hand manipulation skills such as finger-to-palm translation and isolated thumb opposition were practiced during gameplay.  Patient tolerated the activity well with no discomfort and verbalized increased confidence in hand function.  Therapist provided min verbal cues and ergonomic recommendations to ensure proper wrist positioning and avoid aggravation of symptoms.                  PATIENT EDUCATION: Education details: goal progression Person educated: Patient Education method: Explanation, Demonstration, and Verbal cues Education comprehension: verbalized understanding and returned demonstration  HOME EXERCISE PROGRAM: 05/27/24:  basic ROM HEP, positions to avoid and basic activity modifications for holding/lifting objects, incr wear of forearm-based thumb spica splint during the day with lifting, repetitive activities. 06/01/24: Tendon Glides 06/04/24: Median Nerve glides; Putty Activities Access Code: [email or web address] (texted to pt) 06/08/24: Coordination Activities & Joint protection 06/24/2024: sleep positioning; updated Access Code: [email or web address] with isometrics and  eccentrics  GOALS: Goals reviewed with patient? Yes  SHORT TERM GOALS: MET 3/3   LONG TERM GOALS: Target date: 07/11/24  Pt will be independent with strengthening HEP. Baseline: New to outpt OT Goal status: IN Progress  2.  Pt will improve pain and L hand functional use as shown by improving score on Quick Dash to 10% or less. Baseline: 22.7 Goal status: MET 06/29/24: 4.5   3.  Pt will improve lateral pinch strength by at least 2lbs with L hand. Baseline:  14lbs 06/24/2024: 18.5 lbs Goal status: MET  4. NEW Pt will be/ independent with managing pain via appropriate modalities, splints and even KT tape.  Baseline: 06/22/24 Trials of all options with need to learn KT taping techniques for self management. Goal Status: IN Progress    ASSESSMENT:  CLINICAL IMPRESSION:  Patient demonstrates improvement in BUE symptoms vis use of splints, joint protection, KT taping and HEP ideas.  Pt has EMG this week and had no issues with HEP options introduced recently therefore anticipate discharge at next appt. Will monitor appropriate application of KT tape at home this weekend and ongoing tolerance to light strengthening.    PERFORMANCE DEFICITS: in functional skills including ADLs, IADLs, strength, pain, decreased knowledge of precautions, decreased knowledge of use of DME, and UE functional use, and psychosocial skills including habits.  IMPAIRMENTS: are limiting patient from ADLs, IADLs, and leisure.   COMORBIDITIES: may have co-morbidities  that affects occupational performance. Patient will benefit from skilled OT to address above impairments and improve overall function.  REHAB POTENTIAL: Good  PLAN:  OT FREQUENCY: 2x/week  OT DURATION: 6 weeks +eval  PLANNED INTERVENTIONS: 97535 self care/ADL training, 02889 therapeutic exercise, 97530 therapeutic activity, 97112 neuromuscular re-education, 97140 manual therapy, 97035 ultrasound, 97018 paraffin, 02960 fluidotherapy, 97010 moist  heat, 97010 cryotherapy, 97760 Orthotic Initial, 97763 Orthotic/Prosthetic subsequent, passive range of motion, patient/family education, and DME and/or AE instructions  RECOMMENDED OTHER SERVICES: none at this time.  CONSULTED AND AGREED WITH PLAN OF CARE: Patient  PLAN FOR NEXT SESSION:  DC visit Check KT tape application review HEP and update   Jocelyn CHRISTELLA Bottom, OTR/L 07/07/2024, 5:28 PM

## 2024-07-08 ENCOUNTER — Ambulatory Visit: Admitting: Occupational Therapy

## 2024-07-08 DIAGNOSIS — M25532 Pain in left wrist: Secondary | ICD-10-CM

## 2024-07-08 DIAGNOSIS — R278 Other lack of coordination: Secondary | ICD-10-CM

## 2024-07-08 DIAGNOSIS — R208 Other disturbances of skin sensation: Secondary | ICD-10-CM

## 2024-07-08 DIAGNOSIS — M6281 Muscle weakness (generalized): Secondary | ICD-10-CM

## 2024-07-14 ENCOUNTER — Ambulatory Visit (INDEPENDENT_AMBULATORY_CARE_PROVIDER_SITE_OTHER): Payer: Medicare Other

## 2024-07-14 VITALS — Ht 67.0 in | Wt 198.0 lb

## 2024-07-14 DIAGNOSIS — Z0000 Encounter for general adult medical examination without abnormal findings: Secondary | ICD-10-CM

## 2024-07-14 NOTE — Patient Instructions (Signed)
 Ms. Amber English , Thank you for taking time out of your busy schedule to complete your Annual Wellness Visit with me. I enjoyed our conversation and look forward to speaking with you again next year. I, as well as your care team,  appreciate your ongoing commitment to your health goals. Please review the following plan we discussed and let me know if I can assist you in the future. Your Game plan/ To Do List    Referrals: If you haven't heard from the office you've been referred to, please reach out to them at the phone provided.   Follow up Visits: We will see or speak with you next year for your Next Medicare AWV with our clinical staff Have you seen your provider in the last 6 months (3 months if uncontrolled diabetes)? Yes  Clinician Recommendations:  Aim for 30 minutes of exercise or brisk walking, 6-8 glasses of water, and 5 servings of fruits and vegetables each day.       This is a list of the screenings recommended for you:  Health Maintenance  Topic Date Due   Hepatitis C Screening  Never done   Eye exam for diabetics  04/19/2023   Flu Shot  06/12/2024   COVID-19 Vaccine (4 - 2025-26 season) 07/13/2024   Zoster (Shingles) Vaccine (2 of 2) 08/05/2024   Yearly kidney function blood test for diabetes  12/11/2024   Complete foot exam   12/11/2024   Hemoglobin A1C  12/11/2024   DTaP/Tdap/Td vaccine (2 - Td or Tdap) 04/26/2025   Yearly kidney health urinalysis for diabetes  06/10/2025   Medicare Annual Wellness Visit  07/14/2025   Mammogram  01/13/2026   Cologuard (Stool DNA test)  07/18/2026   Pneumococcal Vaccine for age over 64  Completed   DEXA scan (bone density measurement)  Completed   HPV Vaccine  Aged Out   Meningitis B Vaccine  Aged Out    Advanced directives: (ACP Link)Information on Advanced Care Planning can be found at [employer]  Print production planner Health Care Directives Advance Health Care Directives. [email or web address]   Advance Care Planning is important  because it:  [x]  Makes sure you receive the medical care that is consistent with your values, goals, and preferences  [x]  It provides guidance to your family and loved ones and reduces their decisional burden about whether or not they are making the right decisions based on your wishes.  Follow the link provided in your after visit summary or read over the paperwork we have mailed to you to help you started getting your Advance Directives in place. If you need assistance in completing these, please reach out to us  so that we can help you!

## 2024-07-14 NOTE — Progress Notes (Signed)
 Subjective:   Amber English is a 68 y.o. who presents for a Medicare Wellness preventive visit.  As a reminder, Annual Wellness Visits don't include a physical exam, and some assessments may be limited, especially if this visit is performed virtually. We may recommend an in-person follow-up visit with your provider if needed.  Visit Complete: Virtual I connected with  Amber English on 07/14/24 by a audio enabled telemedicine application and verified that I am speaking with the correct person using two identifiers.  Patient Location: Home  Provider Location: Office/Clinic  I discussed the limitations of evaluation and management by telemedicine. The patient expressed understanding and agreed to proceed.  Vital Signs: Because this visit was a virtual/telehealth visit, some criteria may be missing or patient reported. Any vitals not documented were not able to be obtained and vitals that have been documented are patient reported.  VideoDeclined- This patient declined Librarian, academic. Therefore the visit was completed with audio only.  Persons Participating in Visit: Patient.  AWV Questionnaire: Yes: Patient Medicare AWV questionnaire was completed by the patient on 07/14/24; I have confirmed that all information answered by patient is correct and no changes since this date.  Cardiac Risk Factors include: advanced age (>34men, >69 women);diabetes mellitus;dyslipidemia;obesity (BMI >30kg/m2)    Objective:    Today's Vitals   07/14/24 1013  Weight: 198 lb (89.8 kg)  Height: 5' 7 (1.702 m)   Body mass index is 31.01 kg/m.     07/14/2024   10:22 AM 05/27/2024    8:06 AM 07/04/2023    8:52 AM 06/06/2022   11:04 AM  Advanced Directives  Does Patient Have a Medical Advance Directive? No No No No  Would patient like information on creating a medical advance directive?  Yes (MAU/Ambulatory/Procedural Areas - Information given) No - Patient declined      Current Medications (verified) Outpatient Encounter Medications as of 07/14/2024  Medication Sig   atorvastatin  (LIPITOR) 40 MG tablet TAKE 1 TABLET BY MOUTH EVERY DAY FOR CHOLESTEROL   b complex vitamins capsule Take 1 capsule by mouth daily.   calcium -vitamin D (OSCAL WITH D) 500-200 MG-UNIT tablet Take 1 tablet by mouth.   cetirizine  (ZYRTEC ) 10 MG tablet Take 1 tablet (10 mg total) by mouth daily as needed for allergies. For allergies.   diclofenac  (VOLTAREN ) 75 MG EC tablet Take 1 tablet (75 mg total) by mouth at bedtime as needed for moderate pain (pain score 4-6).   gabapentin  (NEURONTIN ) 300 MG capsule TAKE 1 CAPSULE (300 MG TOTAL) BY MOUTH 3 (THREE) TIMES DAILY. FOR NEUROPATHY.   glipiZIDE  (GLUCOTROL ) 10 MG tablet TAKE 1 TABLET (10 MG TOTAL) BY MOUTH 2 (TWO) TIMES DAILY BEFORE A MEAL. FOR DIABETES.   lisinopril  (ZESTRIL ) 2.5 MG tablet TAKE 1 TABLET (2.5 MG TOTAL) BY MOUTH DAILY. FOR KIDNEY PROTECTION.   metFORMIN  (GLUCOPHAGE -XR) 500 MG 24 hr tablet TAKE 2 TABLETS (1,000 MG TOTAL) BY MOUTH 2 (TWO) TIMES DAILY WITH A MEAL. FOR DIABETES.   Multiple Vitamin (MULTIVITAMIN) capsule Take 1 capsule by mouth daily.   No facility-administered encounter medications on file as of 07/14/2024.    Allergies (verified) Patient has no known allergies.   History: Past Medical History:  Diagnosis Date   Diabetes mellitus without complication (HCC)    Hyperlipidemia    Melanoma in situ of scalp (HCC)    Past Surgical History:  Procedure Laterality Date   FOOT FRACTURE SURGERY     TOTAL ABDOMINAL HYSTERECTOMY  2022   Family History  Problem Relation Age of Onset   Esophageal cancer Mother    Diabetes Father    Heart disease Father    Diabetes Sister    Lung cancer Sister    Heart disease Maternal Grandfather    Diabetes Paternal Grandmother    Cancer Paternal Grandmother    Heart disease Paternal Grandfather    Diabetes Brother    Heart disease Brother    Breast cancer Neg Hx     Social History   Socioeconomic History   Marital status: Divorced    Spouse name: Not on file   Number of children: Not on file   Years of education: Not on file   Highest education level: 12th grade  Occupational History   Not on file  Tobacco Use   Smoking status: Never   Smokeless tobacco: Never  Vaping Use   Vaping status: Never Used  Substance and Sexual Activity   Alcohol use: Yes    Comment: occasional   Drug use: Not Currently   Sexual activity: Not Currently    Birth control/protection: None  Other Topics Concern   Not on file  Social History Narrative   Live alone with dog   Social Drivers of Health   Financial Resource Strain: Low Risk  (07/14/2024)   Overall Financial Resource Strain (CARDIA)    Difficulty of Paying Living Expenses: Not hard at all  Food Insecurity: No Food Insecurity (07/14/2024)   Hunger Vital Sign    Worried About Running Out of Food in the Last Year: Never true    Ran Out of Food in the Last Year: Never true  Transportation Needs: No Transportation Needs (07/14/2024)   PRAPARE - Administrator, Civil Service (Medical): No    Lack of Transportation (Non-Medical): No  Physical Activity: Insufficiently Active (07/14/2024)   Exercise Vital Sign    Days of Exercise per Week: 2 days    Minutes of Exercise per Session: 20 min  Stress: No Stress Concern Present (07/14/2024)   [employer] of Occupational Health - Occupational Stress Questionnaire    Feeling of Stress: Not at all  Social Connections: Moderately Isolated (07/14/2024)   Social Connection and Isolation Panel    Frequency of Communication with Friends and Family: More than three times a week    Frequency of Social Gatherings with Friends and Family: Twice a week    Attends Religious Services: Never    Database administrator or Organizations: Yes    Attends Engineer, structural: More than 4 times per year    Marital Status: Divorced    Tobacco  Counseling Counseling given: Not Answered    Clinical Intake:  Pre-visit preparation completed: Yes  Pain : No/denies pain     BMI - recorded: 31.01 Nutritional Status: BMI > 30  Obese Nutritional Risks: None Diabetes: Yes Did pt. bring in CBG monitor from home?: No  Lab Results  Component Value Date   HGBA1C 7.1 (A) 06/10/2024   HGBA1C 6.8 (A) 12/12/2023   HGBA1C 6.5 (A) 06/12/2023     How often do you need to have someone help you when you read instructions, pamphlets, or other written materials from your doctor or pharmacy?: 1 - Never  Interpreter Needed?: No  Comments: lives alone Information entered by :: B.Siyona Coto,LPN   Activities of Daily Living     07/14/2024    9:57 AM  In your present state of health, do you have any  difficulty performing the following activities:  Hearing? 0  Vision? 0  Difficulty concentrating or making decisions? 0  Walking or climbing stairs? 0  Dressing or bathing? 0  Doing errands, shopping? 0  Preparing Food and eating ? N  Using the Toilet? N  In the past six months, have you accidently leaked urine? Y  Do you have problems with loss of bowel control? N  Managing your Medications? N  Managing your Finances? N  Housekeeping or managing your Housekeeping? N    Patient Care Team: Gretta Comer POUR, NP as PCP - General (Internal Medicine) [facility]                                                              I have updated your Care Teams any recent Medical Services you may have received from other providers in the past year.     Assessment:   This is a routine wellness examination for Amber English.  Hearing/Vision screen Hearing Screening - Comments:: Patient says she has small hearing difficulties; not significant Vision Screening - Comments:: Pt says their vision is good with glasses Dr  Lucio   Goals Addressed             This Visit's Progress    Patient Stated   Not on track    07/14/24-Increase exercise and  lose weight.       Depression Screen     07/14/2024   10:20 AM 06/10/2024    8:02 AM 05/20/2024    8:45 AM 12/12/2023    8:03 AM 07/04/2023    8:46 AM 11/27/2022    7:58 AM 06/06/2022   11:05 AM  PHQ 2/9 Scores  PHQ - 2 Score 0 0 0 0 0 0 0  PHQ- 9 Score  0 0        Fall Risk     07/14/2024    9:57 AM 06/10/2024    8:02 AM 05/20/2024    8:44 AM 12/12/2023    8:03 AM 07/03/2023   11:06 AM  Fall Risk   Falls in the past year? 0 1 1 0 0  Number falls in past yr: 0 0 0 0 0  Injury with Fall? 0 1 0 0 0  Risk for fall due to :  History of fall(s) History of fall(s) No Fall Risks No Fall Risks  Follow up  Falls evaluation completed Falls evaluation completed Falls evaluation completed Falls prevention discussed;Falls evaluation completed    MEDICARE RISK AT HOME:  Medicare Risk at Home Any stairs in or around the home?: (Patient-Rptd) Yes If so, are there any without handrails?: (Patient-Rptd) No Home free of loose throw rugs in walkways, pet beds, electrical cords, etc?: (Patient-Rptd) No Adequate lighting in your home to reduce risk of falls?: (Patient-Rptd) Yes Life alert?: (Patient-Rptd) No Use of a cane, walker or w/c?: (Patient-Rptd) No Grab bars in the bathroom?: (Patient-Rptd) No Shower chair or bench in shower?: (Patient-Rptd) No Elevated toilet seat or a handicapped toilet?: (Patient-Rptd) Yes  TIMED UP AND GO:  Was the test performed?  No  Cognitive Function: 6CIT completed        07/14/2024   10:22 AM 07/04/2023    8:55 AM 06/06/2022   11:09 AM  6CIT Screen  What  Year? 0 points 0 points 0 points  What month? 0 points 0 points 0 points  What time? 0 points 0 points 0 points  Count back from 20 0 points 0 points 0 points  Months in reverse 0 points 0 points 0 points  Repeat phrase 0 points 0 points 0 points  Total Score 0 points 0 points 0 points    Immunizations Immunization History  Administered Date(s) Administered   Fluad Trivalent(High Dose 65+) 12/12/2023    INFLUENZA, HIGH DOSE SEASONAL PF 08/17/2021   Influenza Split 11/18/2012   Influenza, Seasonal, Injecte, Preservative Fre 10/07/2013, 11/24/2014   Influenza,inj,quad, With Preservative 09/14/2015, 08/29/2016   Influenza-Unspecified 08/17/2021   PFIZER(Purple Top)SARS-COV-2 Vaccination 02/03/2020, 02/24/2020   PNEUMOCOCCAL CONJUGATE-20 06/12/2023   Pfizer Covid-19 Vaccine Bivalent Booster 85yrs & up 08/17/2021   Pneumococcal Conjugate-13 10/07/2013   Pneumococcal Polysaccharide-23 11/24/2014   Tdap 04/27/2015   Zoster, Live 11/24/2014    Screening Tests Health Maintenance  Topic Date Due   Hepatitis C Screening  Never done   OPHTHALMOLOGY EXAM  04/19/2023   INFLUENZA VACCINE  06/12/2024   COVID-19 Vaccine (4 - 2025-26 season) 07/13/2024   Zoster Vaccines- Shingrix (2 of 2) 08/05/2024   Diabetic kidney evaluation - eGFR measurement  12/11/2024   FOOT EXAM  12/11/2024   HEMOGLOBIN A1C  12/11/2024   DTaP/Tdap/Td (2 - Td or Tdap) 04/26/2025   Diabetic kidney evaluation - Urine ACR  06/10/2025   Medicare Annual Wellness (AWV)  07/14/2025   MAMMOGRAM  01/13/2026   Fecal DNA (Cologuard)  07/18/2026   Pneumococcal Vaccine: 50+ Years  Completed   DEXA SCAN  Completed   HPV VACCINES  Aged Out   Meningococcal B Vaccine  Aged Out    Health Maintenance  Health Maintenance Due  Topic Date Due   Hepatitis C Screening  Never done   OPHTHALMOLOGY EXAM  04/19/2023   INFLUENZA VACCINE  06/12/2024   COVID-19 Vaccine (4 - 2025-26 season) 07/13/2024   Health Maintenance Items Addressed: None due at this time. Pt will receive vaccines at their pharmcy when decided to obtain   Additional Screening:  Vision Screening: Recommended annual ophthalmology exams for early detection of glaucoma and other disorders of the eye. Would you like a referral to an eye doctor? No    Dental Screening: Recommended annual dental exams for proper oral hygiene  Community Resource Referral / Chronic Care  Management: CRR required this visit?  No   CCM required this visit?  No   Plan:    I have personally reviewed and noted the following in the patient's chart:   Medical and social history Use of alcohol, tobacco or illicit drugs  Current medications and supplements including opioid prescriptions. Patient is not currently taking opioid prescriptions. Functional ability and status Nutritional status Physical activity Advanced directives List of other physicians Hospitalizations, surgeries, and ER visits in previous 12 months Vitals Screenings to include cognitive, depression, and falls Referrals and appointments  In addition, I have reviewed and discussed with patient certain preventive protocols, quality metrics, and best practice recommendations. A written personalized care plan for preventive services as well as general preventive health recommendations were provided to patient.   Erminio LITTIE Saris, LPN   [id number]   After Visit Summary: (MyChart) Due to this being a telephonic visit, the after visit summary with patients personalized plan was offered to patient via MyChart   Notes: Nothing significant to report at this time.

## 2024-08-06 ENCOUNTER — Other Ambulatory Visit: Payer: Self-pay | Admitting: Primary Care

## 2024-08-06 DIAGNOSIS — E1141 Type 2 diabetes mellitus with diabetic mononeuropathy: Secondary | ICD-10-CM

## 2024-09-02 ENCOUNTER — Other Ambulatory Visit: Payer: Self-pay | Admitting: Primary Care

## 2024-09-02 DIAGNOSIS — E1141 Type 2 diabetes mellitus with diabetic mononeuropathy: Secondary | ICD-10-CM

## 2024-09-10 ENCOUNTER — Other Ambulatory Visit

## 2024-10-14 DIAGNOSIS — E1141 Type 2 diabetes mellitus with diabetic mononeuropathy: Secondary | ICD-10-CM

## 2024-10-14 MED ORDER — GLIPIZIDE 10 MG PO TABS: 10.0000 mg | ORAL_TABLET | Freq: Two times a day (BID) | ORAL | 1 refills | Status: AC

## 2024-10-28 ENCOUNTER — Other Ambulatory Visit: Payer: Self-pay | Admitting: Primary Care

## 2024-10-28 DIAGNOSIS — E1141 Type 2 diabetes mellitus with diabetic mononeuropathy: Secondary | ICD-10-CM

## 2024-12-05 ENCOUNTER — Other Ambulatory Visit: Payer: Self-pay | Admitting: Medical Genetics

## 2024-12-11 ENCOUNTER — Ambulatory Visit: Payer: Self-pay | Admitting: Primary Care

## 2024-12-11 ENCOUNTER — Ambulatory Visit: Admitting: Primary Care

## 2024-12-11 ENCOUNTER — Encounter: Payer: Self-pay | Admitting: Primary Care

## 2024-12-11 VITALS — BP 120/72 | HR 78 | Temp 98.2°F | Ht 66.75 in | Wt 200.1 lb

## 2024-12-11 DIAGNOSIS — Z23 Encounter for immunization: Secondary | ICD-10-CM

## 2024-12-11 DIAGNOSIS — Z1231 Encounter for screening mammogram for malignant neoplasm of breast: Secondary | ICD-10-CM

## 2024-12-11 DIAGNOSIS — G8929 Other chronic pain: Secondary | ICD-10-CM

## 2024-12-11 DIAGNOSIS — M25512 Pain in left shoulder: Secondary | ICD-10-CM | POA: Diagnosis not present

## 2024-12-11 DIAGNOSIS — Z7984 Long term (current) use of oral hypoglycemic drugs: Secondary | ICD-10-CM

## 2024-12-11 DIAGNOSIS — M25551 Pain in right hip: Secondary | ICD-10-CM

## 2024-12-11 DIAGNOSIS — E1141 Type 2 diabetes mellitus with diabetic mononeuropathy: Secondary | ICD-10-CM

## 2024-12-11 DIAGNOSIS — E2839 Other primary ovarian failure: Secondary | ICD-10-CM

## 2024-12-11 DIAGNOSIS — E7800 Pure hypercholesterolemia, unspecified: Secondary | ICD-10-CM | POA: Diagnosis not present

## 2024-12-11 DIAGNOSIS — M25519 Pain in unspecified shoulder: Secondary | ICD-10-CM | POA: Insufficient documentation

## 2024-12-11 LAB — COMPREHENSIVE METABOLIC PANEL WITH GFR
ALT: 18 U/L (ref 3–35)
AST: 16 U/L (ref 5–37)
Albumin: 4.1 g/dL (ref 3.5–5.2)
Alkaline Phosphatase: 72 U/L (ref 39–117)
BUN: 15 mg/dL (ref 6–23)
CO2: 29 meq/L (ref 19–32)
Calcium: 9.6 mg/dL (ref 8.4–10.5)
Chloride: 106 meq/L (ref 96–112)
Creatinine, Ser: 0.73 mg/dL (ref 0.40–1.20)
GFR: 83.41 mL/min
Glucose, Bld: 133 mg/dL — ABNORMAL HIGH (ref 70–99)
Potassium: 4.8 meq/L (ref 3.5–5.1)
Sodium: 140 meq/L (ref 135–145)
Total Bilirubin: 0.4 mg/dL (ref 0.2–1.2)
Total Protein: 6.7 g/dL (ref 6.0–8.3)

## 2024-12-11 LAB — LIPID PANEL
Cholesterol: 160 mg/dL (ref 28–200)
HDL: 56.3 mg/dL
LDL Cholesterol: 76 mg/dL (ref 10–99)
NonHDL: 104
Total CHOL/HDL Ratio: 3
Triglycerides: 142 mg/dL (ref 10.0–149.0)
VLDL: 28.4 mg/dL (ref 0.0–40.0)

## 2024-12-11 LAB — HEMOGLOBIN A1C: Hgb A1c MFr Bld: 8.1 % — ABNORMAL HIGH (ref 4.6–6.5)

## 2024-12-11 MED ORDER — DICLOFENAC SODIUM 75 MG PO TBEC: 75.0000 mg | DELAYED_RELEASE_TABLET | Freq: Two times a day (BID) | ORAL | 0 refills | Status: AC | PRN

## 2024-12-11 MED ORDER — PREDNISONE 20 MG PO TABS: ORAL_TABLET | ORAL | 0 refills | Status: AC

## 2024-12-11 NOTE — Progress Notes (Signed)
 "  Subjective:    Patient ID: Amber English, female    DOB: 07-Dec-1955, 69 y.o.   MRN: [id number]  Amber English is a very pleasant 69 y.o. female with a history of type 2 diabetes, hyperlipidemia who presents today for follow-up of chronic conditions.   Immunizations: -Tetanus: Completed in 2016 -Influenza: Influenza vaccine provided today.  -Shingles: Completed Zostavax and one dose of Shingrix. She plans on getting the second.  -Pneumonia: Completed Prevnar 20 in 2024  Mammogram: Completed in March 2025 Bone Density Scan: Completed in February 2024  Colonoscopy: Completed Cologuard in September 2024, negative.  Due September 2027   1) Type 2 Diabetes:  Current medications include: Glipizide  10 mg twice daily, metformin  XR 1000 mg twice daily.  She is managed on gabapentin  300 mg twice to three times daily for neuropathy.   She is checking her blood glucose 0 times daily.  Last A1C: 7.1 in July 2025, Last [place] Exam: Up-to-date Last Foot Exam: Due Pneumonia Vaccination: Urine Microalbumin: July 2025 Statin: Atorvastatin   Dietary changes since last visit: Overall healthy diet.    Exercise: None. Active.   Wt Readings from Last 3 Encounters:  12/11/24 200 lb 2 oz (90.8 kg)  07/14/24 198 lb (89.8 kg)  06/10/24 198 lb (89.8 kg)     2) Hyperlipidemia: Currently managed on atorvastatin  40 mg daily.  Last lipid panel with LDL 79 in January 2025. She denies chest pain, shortness of breath, dizziness.   BP Readings from Last 3 Encounters:  12/11/24 120/72  06/10/24 104/62  05/20/24 100/68   3) Shoulder Pain: Acute for the last two months since her RSV vaccine. Her pain is located to the left shoulder with reduced ROM and pain with movement, mostly with lateral abduction. She has taken Ibuprofen HS which helps. She's also applied Voltaren  gel with some temporary improvement.   Review of Systems  Eyes:  Negative for visual disturbance.  Respiratory:  Negative  for shortness of breath.   Cardiovascular:  Negative for chest pain.  Gastrointestinal:  Negative for constipation and diarrhea.  Musculoskeletal:  Positive for arthralgias.  Neurological:  Negative for dizziness and numbness.         Past Medical History:  Diagnosis Date   Allergy    Diabetes mellitus without complication (HCC)    Hyperlipidemia    Melanoma in situ of scalp (HCC)     Social History   Socioeconomic History   Marital status: Divorced    Spouse name: Not on file   Number of children: Not on file   Years of education: Not on file   Highest education level: 12th grade  Occupational History   Not on file  Tobacco Use   Smoking status: Never   Smokeless tobacco: Never  Vaping Use   Vaping status: Never Used  Substance and Sexual Activity   Alcohol use: Yes    Comment: occasional   Drug use: Not Currently   Sexual activity: Not Currently    Birth control/protection: None  Other Topics Concern   Not on file  Social History Narrative   Live alone with dog   Social Drivers of Health   Tobacco Use: Low Risk (12/11/2024)   Patient History    Smoking Tobacco Use: Never    Smokeless Tobacco Use: Never    Passive Exposure: Not on file  Financial Resource Strain: Low Risk (12/10/2024)   Overall Financial Resource Strain (CARDIA)    Difficulty of Paying Living Expenses: Not hard  at all  Food Insecurity: No Food Insecurity (12/10/2024)   Epic    Worried About Programme Researcher, Broadcasting/film/video in the Last Year: Never true    Ran Out of Food in the Last Year: Never true  Transportation Needs: No Transportation Needs (12/10/2024)   Epic    Lack of Transportation (Medical): No    Lack of Transportation (Non-Medical): No  Physical Activity: Inactive (12/10/2024)   Exercise Vital Sign    Days of Exercise per Week: 0 days    Minutes of Exercise per Session: Not on file  Stress: No Stress Concern Present (12/10/2024)   [employer] of Occupational Health - Occupational  Stress Questionnaire    Feeling of Stress: Not at all  Social Connections: Moderately Isolated (12/10/2024)   Social Connection and Isolation Panel    Frequency of Communication with Friends and Family: More than three times a week    Frequency of Social Gatherings with Friends and Family: Twice a week    Attends Religious Services: Patient declined    Active Member of Clubs or Organizations: Yes    Attends Engineer, Structural: More than 4 times per year    Marital Status: Divorced  Intimate Partner Violence: Not At Risk (07/14/2024)   Epic    Fear of Current or Ex-Partner: No    Emotionally Abused: No    Physically Abused: No    Sexually Abused: No  Depression (PHQ2-9): Low Risk (12/11/2024)   Depression (PHQ2-9)    PHQ-2 Score: 1  Alcohol Screen: Low Risk (12/10/2024)   Alcohol Screen    Last Alcohol Screening Score (AUDIT): 2  Housing: Low Risk (12/10/2024)   Epic    Unable to Pay for Housing in the Last Year: No    Number of Times Moved in the Last Year: 0    Homeless in the Last Year: No  Utilities: Not At Risk (07/14/2024)   Epic    Threatened with loss of utilities: No  Health Literacy: Adequate Health Literacy (07/14/2024)   B1300 Health Literacy    Frequency of need for help with medical instructions: Never    Past Surgical History:  Procedure Laterality Date   FOOT FRACTURE SURGERY     FRACTURE SURGERY     TOTAL ABDOMINAL HYSTERECTOMY  2022    Family History  Problem Relation Age of Onset   Esophageal cancer Mother    Alcohol abuse Mother    Cancer Mother    Arthritis Mother    Diabetes Father    Heart disease Father    Alcohol abuse Father    Varicose Veins Father    Diabetes Sister    Lung cancer Sister    Cancer Sister    Heart disease Maternal Grandfather    Diabetes Paternal Grandmother    Cancer Paternal Grandmother    Heart disease Paternal Grandfather    Diabetes Brother    Heart disease Brother    Alcohol abuse Brother    Breast cancer  Neg Hx     Allergies[1]  Medications Ordered Prior to Encounter[2]  BP 120/72   Pulse 78   Temp 98.2 F (36.8 C) (Oral)   Ht 5' 6.75 (1.695 m)   Wt 200 lb 2 oz (90.8 kg)   SpO2 100%   BMI 31.58 kg/m  Objective:   Physical Exam Cardiovascular:     Rate and Rhythm: Normal rate and regular rhythm.  Pulmonary:     Effort: Pulmonary effort is normal.  Breath sounds: Normal breath sounds.  Abdominal:     General: Bowel sounds are normal.     Palpations: Abdomen is soft.     Tenderness: There is no abdominal tenderness.  Musculoskeletal:     Left shoulder: Decreased range of motion.     Cervical back: Neck supple.     Comments: Decrease in ROM with lateral abduction  Skin:    General: Skin is warm and dry.  Neurological:     Mental Status: She is alert and oriented to person, place, and time.  Psychiatric:        Mood and Affect: Mood normal.     Physical Exam        Assessment & Plan:  Type 2 diabetes mellitus with diabetic mononeuropathy, without long-term current use of insulin (HCC) Assessment & Plan: Repeat A1C pending.  Work on a healthy diet and regular exercise in order for weight loss, and to reduce the risk of further co-morbidity.  Continue metformin  ER 1000 mg BID, glipizide  10 mg BID.  Urine microalbumin UTD.   Follow up in 6 months.  Orders: -     Hemoglobin A1c -     Comprehensive metabolic panel with GFR  Chronic pain of right hip -     Diclofenac  Sodium; Take 1 tablet (75 mg total) by mouth 2 (two) times daily as needed for moderate pain (pain score 4-6).  Dispense: 180 tablet; Refill: 0  Pure hypercholesterolemia Assessment & Plan: Repeat lipid panel pending.  Work on a healthy diet and regular exercise in order for weight loss, and to reduce the risk of further co-morbidity. Continue atorvastatin  40 mg daily.  Orders: -     Lipid panel  Acute pain of left shoulder Assessment & Plan: HPI and exam today suggestive of  bursitis.  Discussed treatment options.  Start prednisone  tablets. Take two tablets my mouth once daily in the morning for four days, then one tablet once daily in the morning for four days.   When she completes the prednisone  course she can start oral diclofenac  75 mg twice daily with meals. She will update.   Orders: -     predniSONE ; Take two tablets my mouth once daily in the morning for four days, then one tablet once daily in the morning for four days.  Dispense: 12 tablet; Refill: 0  Screening mammogram for breast cancer -     3D Screening Mammogram, Left and Right; Future  Estrogen deficiency -     DG Bone Density; Future  Need for influenza vaccination -     Flu vaccine HIGH DOSE PF(Fluzone Trivalent)    Assessment and Plan Assessment & Plan         Comer MARLA Gaskins, NP       [1] No Known Allergies [2]  Current Outpatient Medications on File Prior to Visit  Medication Sig Dispense Refill   atorvastatin  (LIPITOR) 40 MG tablet TAKE 1 TABLET BY MOUTH EVERY DAY FOR CHOLESTEROL 90 tablet 3   b complex vitamins capsule Take 1 capsule by mouth daily.     calcium -vitamin D (OSCAL WITH D) 500-200 MG-UNIT tablet Take 1 tablet by mouth.     cetirizine  (ZYRTEC ) 10 MG tablet Take 1 tablet (10 mg total) by mouth daily as needed for allergies. For allergies. 90 tablet 0   gabapentin  (NEURONTIN ) 300 MG capsule TAKE 1 CAPSULE (300 MG TOTAL) BY MOUTH 3 (THREE) TIMES DAILY. FOR NEUROPATHY. 270 capsule 2   glipiZIDE  (GLUCOTROL ) 10 MG  tablet Take 1 tablet (10 mg total) by mouth 2 (two) times daily before a meal. for diabetes. 180 tablet 1   lisinopril  (ZESTRIL ) 2.5 MG tablet TAKE 1 TABLET BY MOUTH DAILY FOR KIDNEY PROTECTION. 90 tablet 0   MAGNESIUM PO Take by mouth daily.     metFORMIN  (GLUCOPHAGE -XR) 500 MG 24 hr tablet TAKE 2 TABLETS (1,000 MG TOTAL) BY MOUTH 2 (TWO) TIMES DAILY WITH A MEAL. FOR DIABETES. 360 tablet 1   Multiple Vitamin (MULTIVITAMIN) capsule Take 1 capsule  by mouth daily.     No current facility-administered medications on file prior to visit.   "

## 2024-12-11 NOTE — Assessment & Plan Note (Signed)
 Repeat A1C pending.  Work on a healthy diet and regular exercise in order for weight loss, and to reduce the risk of further co-morbidity.  Continue metformin  ER 1000 mg BID, glipizide  10 mg BID.  Urine microalbumin UTD.   Follow up in 6 months.

## 2024-12-11 NOTE — Assessment & Plan Note (Signed)
 HPI and exam today suggestive of bursitis.  Discussed treatment options.  Start prednisone  tablets. Take two tablets my mouth once daily in the morning for four days, then one tablet once daily in the morning for four days.   When she completes the prednisone  course she can start oral diclofenac  75 mg twice daily with meals. She will update.

## 2024-12-11 NOTE — Patient Instructions (Addendum)
 Call:  [phone] Mobile Mammogram Ask them about the mobile bone density scan.  Stop by the lab prior to leaving today. I will notify you of your results once received.   Start prednisone  tablets. Take two tablets my mouth once daily in the morning for four days, then one tablet once daily in the morning for four days.   You may start the diclofenac  medication for pain and inflammation when she finished the prednisone .  Take 1 tablet by mouth twice daily with food.  Avoid ibuprofen while taking prednisone .  You may take Tylenol if needed.  It was a pleasure to see you today!

## 2024-12-11 NOTE — Assessment & Plan Note (Signed)
 Repeat lipid panel pending.  Work on a healthy diet and regular exercise in order for weight loss, and to reduce the risk of further co-morbidity. Continue atorvastatin  40 mg daily.

## 2024-12-13 ENCOUNTER — Other Ambulatory Visit: Payer: Self-pay | Admitting: Primary Care

## 2024-12-13 DIAGNOSIS — E1141 Type 2 diabetes mellitus with diabetic mononeuropathy: Secondary | ICD-10-CM

## 2025-01-06 ENCOUNTER — Other Ambulatory Visit (HOSPITAL_COMMUNITY)

## 2025-01-15 ENCOUNTER — Encounter

## 2025-06-08 ENCOUNTER — Ambulatory Visit: Admitting: Primary Care

## 2025-07-15 ENCOUNTER — Ambulatory Visit

## 2025-07-16 ENCOUNTER — Ambulatory Visit

## 2025-12-14 ENCOUNTER — Encounter: Admitting: Primary Care
